# Patient Record
Sex: Female | Born: 1980 | Race: Black or African American | Hispanic: No | Marital: Single | Smoking: Current every day smoker
Health system: Southern US, Community
[De-identification: ages and names within clinical notes are randomized; demographics above are authoritative.]

## PROBLEM LIST (undated history)

## (undated) DIAGNOSIS — O09299 Supervision of pregnancy with other poor reproductive or obstetric history, unspecified trimester: Secondary | ICD-10-CM

## (undated) DIAGNOSIS — I10 Essential (primary) hypertension: Secondary | ICD-10-CM

## (undated) DIAGNOSIS — O09529 Supervision of elderly multigravida, unspecified trimester: Secondary | ICD-10-CM

## (undated) DIAGNOSIS — Z8759 Personal history of other complications of pregnancy, childbirth and the puerperium: Secondary | ICD-10-CM

## (undated) DIAGNOSIS — O24419 Gestational diabetes mellitus in pregnancy, unspecified control: Secondary | ICD-10-CM

## (undated) DIAGNOSIS — Z8489 Family history of other specified conditions: Secondary | ICD-10-CM

## (undated) DIAGNOSIS — D649 Anemia, unspecified: Secondary | ICD-10-CM

## (undated) HISTORY — DX: Supervision of elderly multigravida, unspecified trimester: O09.529

## (undated) HISTORY — DX: Anemia, unspecified: D64.9

## (undated) HISTORY — DX: Personal history of other complications of pregnancy, childbirth and the puerperium: Z87.59

## (undated) HISTORY — PX: DILATION AND CURETTAGE OF UTERUS: SHX78

## (undated) HISTORY — DX: Gestational diabetes mellitus in pregnancy, unspecified control: O24.419

## (undated) HISTORY — DX: Supervision of pregnancy with other poor reproductive or obstetric history, unspecified trimester: O09.299

---

## 2003-05-20 DIAGNOSIS — O1493 Unspecified pre-eclampsia, third trimester: Secondary | ICD-10-CM

## 2006-09-17 ENCOUNTER — Emergency Department: Payer: Self-pay

## 2008-04-02 IMAGING — US ABDOMEN ULTRASOUND
1 series · 17 of 25 positions shown · non-contrast
Comparison: none

REASON FOR EXAM: RUQ pain; evaluate GB; [HOSPITAL]
COMMENTS:

[Series 1: abdomen ultrasound · 17 of 54 slices shown]
[im 1/54]
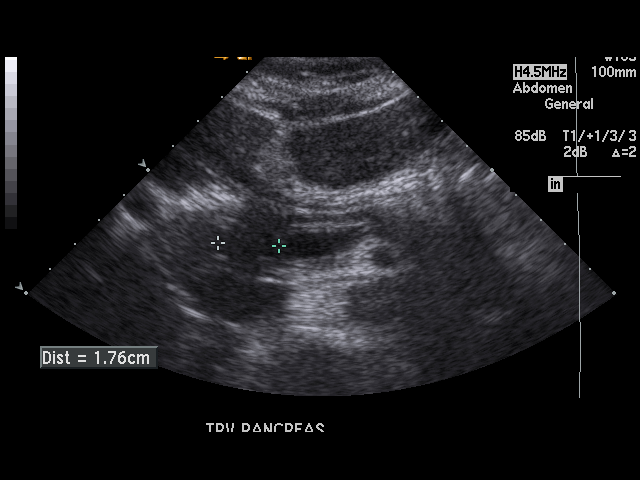
[im 5/54]
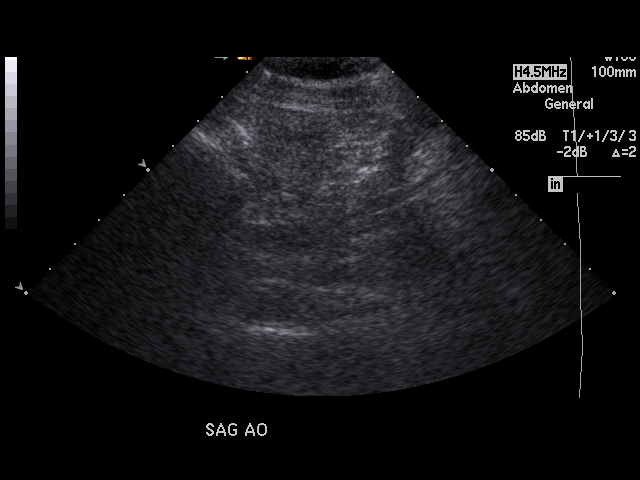
[im 7/54]
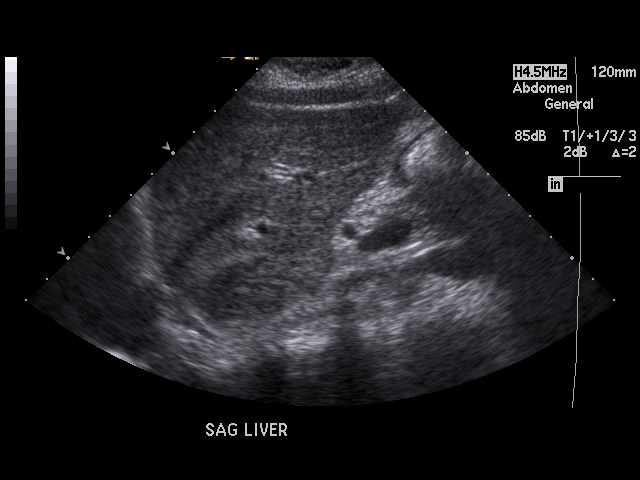
[im 12/54]
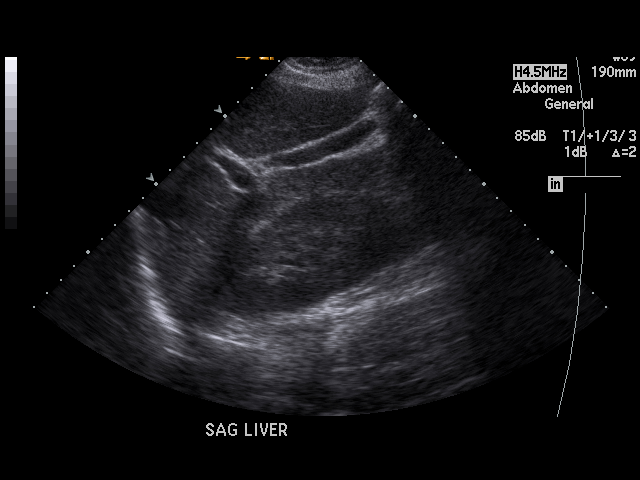
[im 14/54]
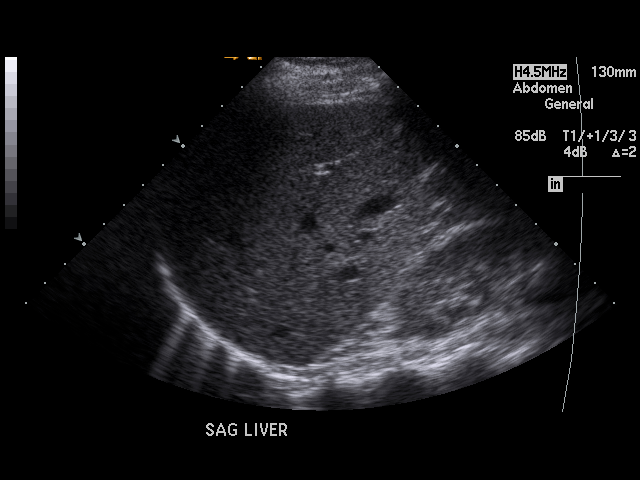
[im 18/54]
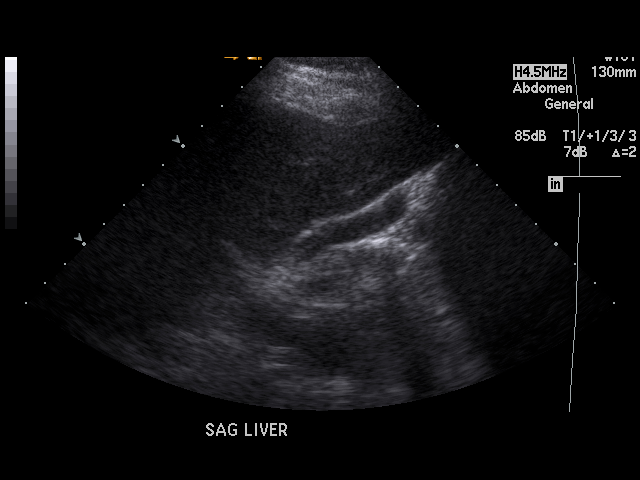
[im 20/54]
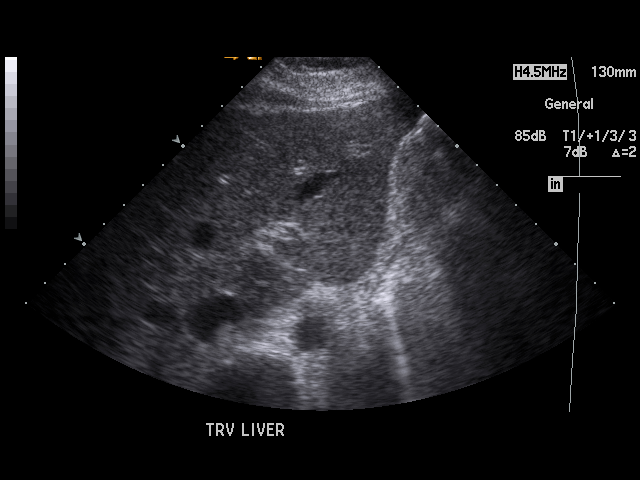
[im 25/54]
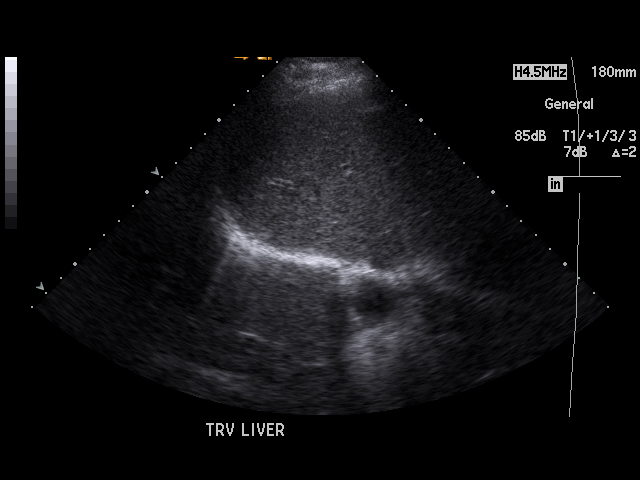
[im 27/54]
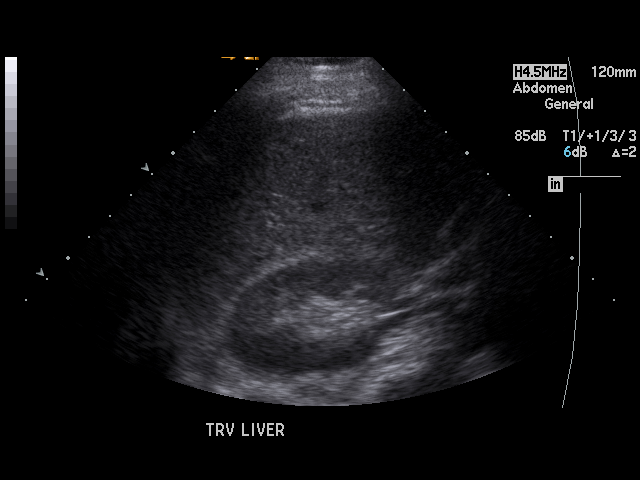
[im 29/54]
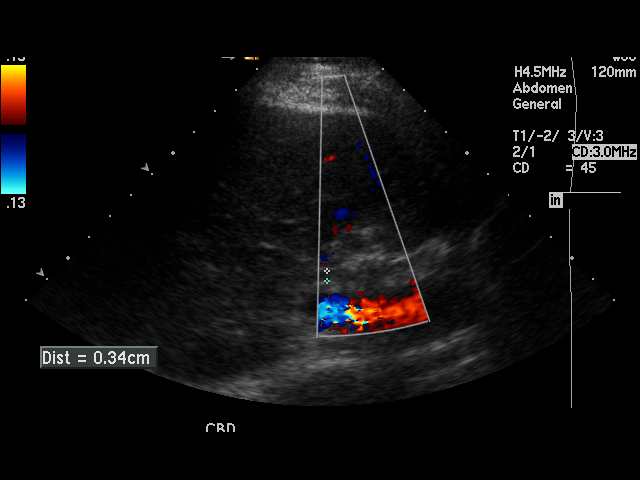
[im 34/54]
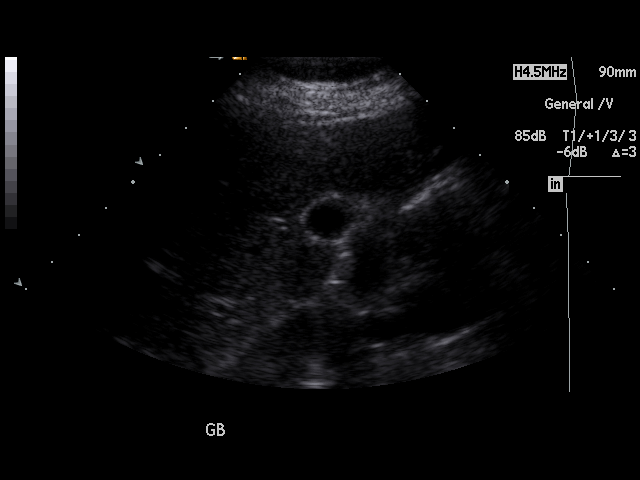
[im 36/54]
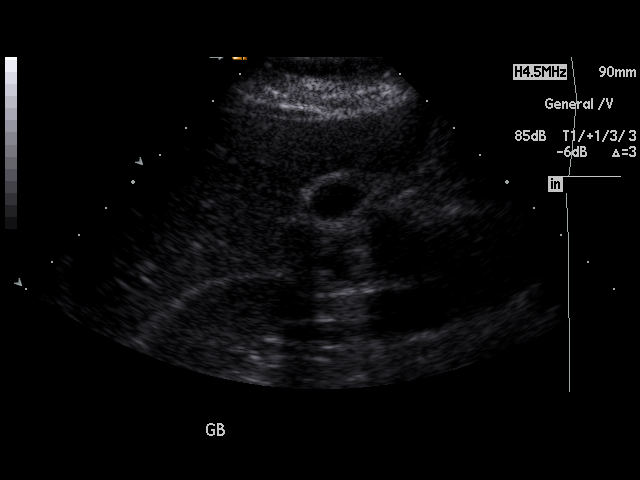
[im 40/54]
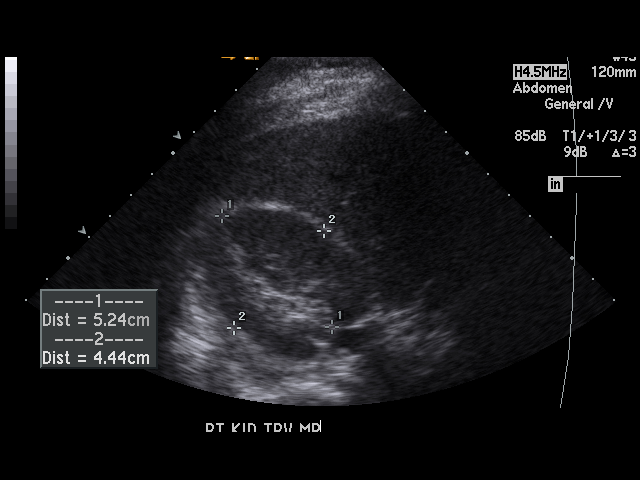
[im 42/54]
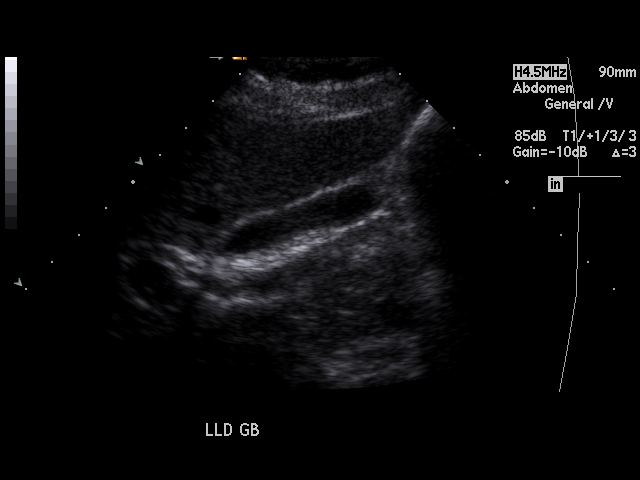
[im 47/54]
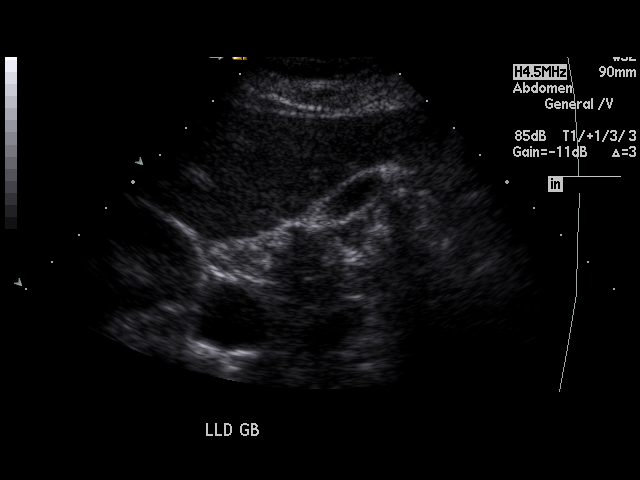
[im 49/54]
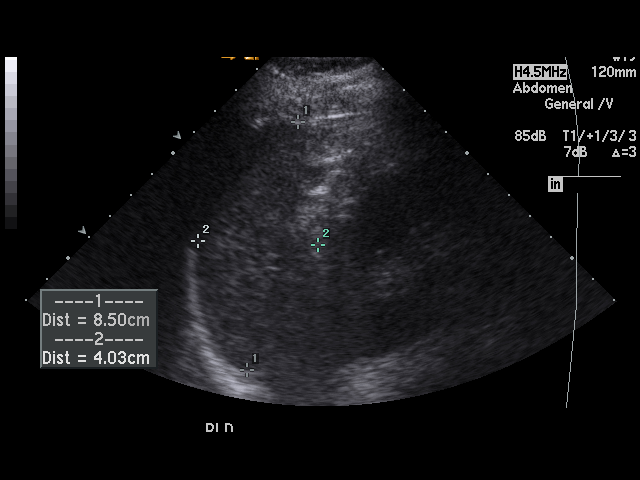
[im 54/54]
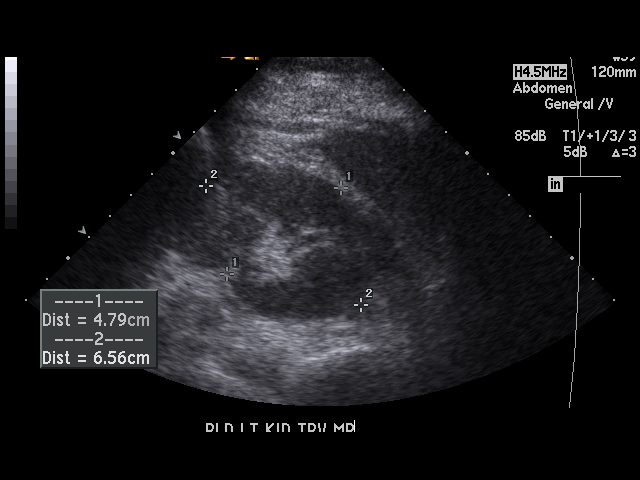

[17 of 25 positions shown; findings below may reference images not displayed]

PROCEDURE:     US  - US ABDOMEN GENERAL SURVEY  - September 17, 2006  [DATE]

RESULT:     The liver, spleen, pancreas and abdominal aorta are normal in
appearance. No gallstones are seen. There is no thickening of the
gallbladder wall. The common bile duct measures 3.4 mm in diameter, which is
within normal limits. The kidneys show no hydronephrosis. There is no
ascites.
IMPRESSION: 1.     No significant abnormalities are noted.

## 2012-11-15 ENCOUNTER — Other Ambulatory Visit: Payer: Self-pay

## 2012-11-19 LAB — WOUND CULTURE

## 2013-08-13 ENCOUNTER — Emergency Department: Payer: Self-pay | Admitting: Emergency Medicine

## 2016-02-04 LAB — OB RESULTS CONSOLE HEPATITIS B SURFACE ANTIGEN: Hepatitis B Surface Ag: NEGATIVE

## 2016-02-04 LAB — OB RESULTS CONSOLE ABO/RH: RH TYPE: POSITIVE

## 2016-02-04 LAB — OB RESULTS CONSOLE RUBELLA ANTIBODY, IGM: Rubella: NON-IMMUNE/NOT IMMUNE

## 2016-02-04 LAB — OB RESULTS CONSOLE HIV ANTIBODY (ROUTINE TESTING): HIV: NONREACTIVE

## 2016-02-04 LAB — OB RESULTS CONSOLE RPR: RPR: NONREACTIVE

## 2016-02-04 LAB — OB RESULTS CONSOLE VARICELLA ZOSTER ANTIBODY, IGG: Varicella: IMMUNE

## 2016-02-22 LAB — OB RESULTS CONSOLE HIV ANTIBODY (ROUTINE TESTING): HIV: NONREACTIVE

## 2016-02-22 LAB — OB RESULTS CONSOLE HGB/HCT, BLOOD
HCT: 31 %
Hemoglobin: 10.4 g/dL

## 2016-02-22 LAB — SICKLE CELL SCREEN: SICKLE CELL SCREEN: NORMAL

## 2016-02-22 LAB — OB RESULTS CONSOLE PLATELET COUNT: Platelets: 238 10*3/uL

## 2016-02-22 LAB — OB RESULTS CONSOLE RPR: RPR: NONREACTIVE

## 2016-05-01 NOTE — L&D Delivery Note (Signed)
Delivery Note At 4:13 AM a viable female was delivered via Vaginal, Spontaneous Delivery (Presentation:OA  ).  APGAR: 8, 9; weight 5 lb 8.2 oz (2500 g).   Placenta status:spontaneous and intact.  Cord: 3VC with the following complications: preeclampsia with severe features (BP), GDM, AMA.  Cord pH: N/A  Anesthesia:  Epidural Episiotomy:  none Lacerations:  none Suture Repair: none Est. Blood Loss (mL):  300  Mom to postpartum.  Baby to Couplet care / Skin to Skin.  Vena Austria 08/12/2016, 4:35 AM

## 2016-05-30 DIAGNOSIS — O09529 Supervision of elderly multigravida, unspecified trimester: Secondary | ICD-10-CM | POA: Diagnosis not present

## 2016-05-30 DIAGNOSIS — Z131 Encounter for screening for diabetes mellitus: Secondary | ICD-10-CM | POA: Diagnosis not present

## 2016-05-30 DIAGNOSIS — Z3A23 23 weeks gestation of pregnancy: Secondary | ICD-10-CM | POA: Diagnosis not present

## 2016-05-30 DIAGNOSIS — Z3A27 27 weeks gestation of pregnancy: Secondary | ICD-10-CM | POA: Diagnosis not present

## 2016-06-06 DIAGNOSIS — R7302 Impaired glucose tolerance (oral): Secondary | ICD-10-CM | POA: Diagnosis not present

## 2016-06-13 ENCOUNTER — Encounter: Payer: BLUE CROSS/BLUE SHIELD | Attending: Advanced Practice Midwife | Admitting: Dietician

## 2016-06-13 ENCOUNTER — Encounter: Payer: Self-pay | Admitting: Dietician

## 2016-06-13 VITALS — BP 136/70 | Ht 62.0 in | Wt 165.6 lb

## 2016-06-13 DIAGNOSIS — O24419 Gestational diabetes mellitus in pregnancy, unspecified control: Secondary | ICD-10-CM | POA: Insufficient documentation

## 2016-06-13 DIAGNOSIS — O24410 Gestational diabetes mellitus in pregnancy, diet controlled: Secondary | ICD-10-CM

## 2016-06-13 DIAGNOSIS — Z3A00 Weeks of gestation of pregnancy not specified: Secondary | ICD-10-CM | POA: Diagnosis not present

## 2016-06-13 NOTE — Progress Notes (Signed)
Appt. Start Time: 1030 Appt. End Time: 1200  GDM Class 1 Diabetes Overview - define DM; state own type of DM; identify functions of pancreas and insulin; define insulin deficiency vs insulin resistance  Psychosocial - identify DM as a source of stress; state the effects of stress on BG control; verbalize appropriate stress management techniques; identify personal stress issues   Nutritional Management - describe effects of food on blood glucose; identify sources of carbohydrate, protein and fat; verbalize the importance of balance meals in controlling blood glucose; identify meals as well balanced or not; estimate servings of carbohydrate from menus; use food labels to identify servings size, content of carbohydrate, fiber, protein, fat, saturated fat and sodium; recognize food sources of fat, saturated fat, trans fat, sodium and verbalize goals for intake; describe healthful appropriate food choices when dining out   Exercise - describe the effects of exercise on blood glucose and importance of regular exercise in controlling diabetes; state a plan for personal exercise; verbalize contraindications for exercise  Medications - state name, dose, timing of currently prescribed medications; describe types of medications available for diabetes  Insulin Training - prepare and administer insulin accurately; state correct rotation pattern; verbalize safe and lawful needle disposal  Self-Monitoring - state importance of HBGM and demo procedure accurately; use HBGM results to effectively manage diabetes; identify importance of regular HbA1C testing and goals for results  Acute Complications/Sick Day Guidelines - recognize hyperglycemia and hypoglycemia with causes and effects; identify blood glucose results as high, low or in control; list steps in treating and preventing high and low blood glucose; state appropriate measure to manage blood glucose when ill (need for meds, HBGM plan, when to call physician,  need for fluids)  Chronic Complications/Foot, Skin, Eye Dental Care - identify possible long-term complications of diabetes (retinopathy, neuropathy, nephropathy, cardiovascular disease, infections); explain steps in prevention and treatment of chronic complications; state importance of daily self-foot exams; describe how to examine feet and what to look for; explain appropriate eye and dental care  Lifestyle Changes/Goals & Health/Community Resources - state benefits of making appropriate lifestyle changes; identify habits that need to change (meals, tobacco, alcohol); identify strategies to reduce risk factors for personal health; set goals for proper diabetes care; state need for and frequency of healthcare follow-up; describe appropriate community resources for good health (ADA, web sites, apps)   Pregnancy/Sexual Health - define gestational diabetes; state importance of good blood glucose control and birth control prior to pregnancy; state importance of good blood glucose control in preventing sexual problems (impotence, vaginal dryness, infections, loss of desire); state relationship of blood glucose control and pregnancy outcome; describe risk of maternal and fetal complications  Teaching Materials Used: Meter-Accuchek Guide meter General Meal Planning Guidelines Daily Food Record Gestational Diabetes Booklet Gestational Video Goals for Healthy Pregnancy  

## 2016-06-13 NOTE — Patient Instructions (Signed)
Read booklet on Gestational Diabetes Follow Gestational Meal Planning Guidelines Complete a 3 Day Food Record and bring to next appointment Check blood sugars 4 x day - before breakfast and 2 hrs after every meal and record  Bring blood sugar log to all appointments Call MD for prescription for meter strips and lancets Strips   Accuchek Guide test strips  Lancets  Accuchek Fast Clix lancets Walk 20-30 minutes at least 5 x week if permitted by MD Next appointment  06-19-16

## 2016-06-16 ENCOUNTER — Ambulatory Visit: Payer: Self-pay | Admitting: *Deleted

## 2016-06-19 ENCOUNTER — Ambulatory Visit: Payer: BLUE CROSS/BLUE SHIELD | Admitting: Dietician

## 2016-06-21 ENCOUNTER — Telehealth: Payer: Self-pay | Admitting: Dietician

## 2016-06-21 NOTE — Telephone Encounter (Signed)
Called patient to reschedule appointment, which she missed on 06/19/16. Left voicemail message requesting a call back.

## 2016-06-27 ENCOUNTER — Encounter: Payer: BLUE CROSS/BLUE SHIELD | Admitting: Dietician

## 2016-06-27 ENCOUNTER — Encounter: Payer: Self-pay | Admitting: Obstetrics and Gynecology

## 2016-06-27 ENCOUNTER — Encounter: Payer: Self-pay | Admitting: Dietician

## 2016-06-27 ENCOUNTER — Ambulatory Visit (INDEPENDENT_AMBULATORY_CARE_PROVIDER_SITE_OTHER): Payer: BLUE CROSS/BLUE SHIELD | Admitting: Obstetrics and Gynecology

## 2016-06-27 VITALS — BP 128/66 | Wt 167.0 lb

## 2016-06-27 VITALS — BP 120/70 | Ht 62.0 in | Wt 167.2 lb

## 2016-06-27 DIAGNOSIS — O24415 Gestational diabetes mellitus in pregnancy, controlled by oral hypoglycemic drugs: Secondary | ICD-10-CM

## 2016-06-27 DIAGNOSIS — O09293 Supervision of pregnancy with other poor reproductive or obstetric history, third trimester: Secondary | ICD-10-CM | POA: Insufficient documentation

## 2016-06-27 DIAGNOSIS — O24410 Gestational diabetes mellitus in pregnancy, diet controlled: Secondary | ICD-10-CM

## 2016-06-27 DIAGNOSIS — Z23 Encounter for immunization: Secondary | ICD-10-CM

## 2016-06-27 DIAGNOSIS — O09523 Supervision of elderly multigravida, third trimester: Secondary | ICD-10-CM

## 2016-06-27 DIAGNOSIS — O24419 Gestational diabetes mellitus in pregnancy, unspecified control: Secondary | ICD-10-CM | POA: Diagnosis not present

## 2016-06-27 DIAGNOSIS — O99013 Anemia complicating pregnancy, third trimester: Secondary | ICD-10-CM

## 2016-06-27 DIAGNOSIS — Z3A31 31 weeks gestation of pregnancy: Secondary | ICD-10-CM

## 2016-06-27 HISTORY — DX: Supervision of elderly multigravida, third trimester: O09.523

## 2016-06-27 MED ORDER — METFORMIN HCL 500 MG PO TABS: 500.0000 mg | ORAL_TABLET | Freq: Two times a day (BID) | ORAL | 11 refills | Status: DC

## 2016-06-27 MED ORDER — RANITIDINE HCL 150 MG PO TABS: 150.0000 mg | ORAL_TABLET | Freq: Two times a day (BID) | ORAL | 11 refills | Status: DC

## 2016-06-27 NOTE — Progress Notes (Signed)
Pt had chest pain/heartburn for about 3 hours

## 2016-06-27 NOTE — Patient Instructions (Signed)
   Make sure to include enough protein at breakfast to feel full, and reduce hunger later in the day.   You can increase protein portions and/or "free" vegetables to improve fulness.

## 2016-06-27 NOTE — Progress Notes (Signed)
   Patient's verbal BG report indicates fasting BGs ranging 80-90; after breakfast BGs 80-120; after lunch and supper 160-180.   Patient's food history indicates she is eating at regular intervals. She reports feeling hungry after eating breakfast and sometimes after lunch, and feels she is then likely overeating carbohydrate at supper.   Provided 1800kcal meal plan, and wrote individualized menus based on patient's food preferences. Advised increasing portions of protein foods and/or "free" vegetables to help with hunger control.   Instructed patient on food safety, including avoidance of Listeriosis, and limiting mercury from fish.  Discussed importance of maintaining healthy lifestyle habits to reduce risk of Type 2 DM as well as Gestational DM with any future pregnancies.  Advised patient to use any remaining testing supplies to test some BGs after delivery, and to have BG tested ideally annually, as well as prior to attempting future pregnancies.

## 2016-06-27 NOTE — Progress Notes (Signed)
Routine Prenatal Care Visit  Subjective  Fetal Movement? yes Contractions? no Leaking Fluid? no Vaginal Bleeding? no  Objective   Vitals:   06/27/16 1619  BP: 128/66    @WEIGHTCHANGE @ Urine dipstick shows negative for all components.  General: NAD Pumonary: no increased work of breathing Abdomen: gravid, non-tender Extremities: no edema Psychiatric: mood appropriate, affect full  O -- -- Negative -- Nonreactive -- (10/06 0000)   Assessment   36 y.o. G1P0 at 4982w1d by  08/28/2016, by Ultrasound presenting for routine prenatal visit, GDM  pregnancy #5 Problems (from 11/22/15 to present)    No problems associated with this episode.      Plan  Gestational diabetes  - Discussed ADA diet, in addition for obese patient diagnosed prior to 20 weeks recommend caloric restriction to 25 Kcal/kg/day ideal body weight.  Carbohydrates should be limited to 33-40% of daily caloric intake with attention given to high glycemic index vs low glycemic index foods.  The remaining calories should be split approximately 20% protein, 40% fats.  Three meals a day with two snacks to spread carbohdyrate load was recommended. - Recommend lifestyles referral for more in depth diet teaching - Home glucose monitoring 4 times a day (AM fasting goal <100, 2-hr postprandial <120).  Importance in maintaining glucose log discussed as proper management will rely on assessing home BG readings.  Stressed team approach with patient being a vital member of this team in ensuring best possible outcome for her fetus. - If good control twice weekly follow up appopriate - Antepartum testing indicated at 32-34 weeks if on meds for glycemic control, monthly growth scan starting at 28 weeks - For diet controlled gestational diabetes induction by 40 weeks if favorable cervix, delivery at latest by 41 weeks - Evidence of fetal macrosomia or poor control may necessitate earlier delivery, 39 weeks or less.  If fetal weight  >4500g counseling regarding risk of shoulder dystocia and consideration of C-section discussed. - If pharmacotherapy should need to be started insulin remains the ADA recommended treatment.  Metformin may be used but this indication is not FDA approved, it readily crosses the placenta and while 2-year follow up data have not shown significant developmental differences long term follow up data is unavailable.  Glyburide may be used as a second line oral agent but is not superior to insulin in preventing macrosomia, may be associated with increased rates of neonatal hypyoglycemia, and is not FDA approved for this indication.  - Stressed that diabetes is a common complicating factor in pregnancy affecting about 7% of pregnancies - Managed properly and with patient adherence to monitoring, diet and lifestyle changes does not have to be associated with adverse pregnancy outcome.  However, uncontrolled may lead to adverse outcomes including still birth, shoulder dystocia, need for cesarean delivery. - Increased risk of preeclampsia in patient diagnosed with gestational diabetes discussed - Increased lifetime risk of develop Type II diabetes discussed  ACOG Practive Bulletin 190 "Gestatioal Diabetes" Updated Februrary 2018  - Fasting BG at goal, 2-hr postprandial lunch and dinner elevated, start metformin 500mg  po bid today, follow up ultrasound for growth in 1 week and to reassess BG control

## 2016-07-03 ENCOUNTER — Other Ambulatory Visit: Payer: Self-pay | Admitting: Obstetrics and Gynecology

## 2016-07-03 ENCOUNTER — Ambulatory Visit (INDEPENDENT_AMBULATORY_CARE_PROVIDER_SITE_OTHER): Payer: BLUE CROSS/BLUE SHIELD | Admitting: Obstetrics and Gynecology

## 2016-07-03 ENCOUNTER — Ambulatory Visit (INDEPENDENT_AMBULATORY_CARE_PROVIDER_SITE_OTHER): Payer: BLUE CROSS/BLUE SHIELD

## 2016-07-03 VITALS — BP 132/64 | Wt 166.0 lb

## 2016-07-03 DIAGNOSIS — O24415 Gestational diabetes mellitus in pregnancy, controlled by oral hypoglycemic drugs: Secondary | ICD-10-CM | POA: Diagnosis not present

## 2016-07-03 DIAGNOSIS — O99013 Anemia complicating pregnancy, third trimester: Secondary | ICD-10-CM

## 2016-07-03 DIAGNOSIS — O09293 Supervision of pregnancy with other poor reproductive or obstetric history, third trimester: Secondary | ICD-10-CM

## 2016-07-03 DIAGNOSIS — O09523 Supervision of elderly multigravida, third trimester: Secondary | ICD-10-CM

## 2016-07-03 DIAGNOSIS — Z3A31 31 weeks gestation of pregnancy: Secondary | ICD-10-CM

## 2016-07-03 DIAGNOSIS — Z3A32 32 weeks gestation of pregnancy: Secondary | ICD-10-CM

## 2016-07-03 NOTE — Progress Notes (Signed)
Growth US today

## 2016-07-03 NOTE — Progress Notes (Signed)
Noted some GI symptoms with metformin, only spent 3-hr at work last week.  Inquires about being taken out.  BG remain randomly elevated per patient no log today, growth appropriate AC small at 6.9%ile.  Will decrease glyburide to qHS, re-evaluate in 1 week

## 2016-07-04 ENCOUNTER — Telehealth: Payer: Self-pay | Admitting: Obstetrics and Gynecology

## 2016-07-04 NOTE — Telephone Encounter (Signed)
Pt is calling today needing to speak to Dr. Bonney AidStaebler about her short term disability form. Please advise Cb#[phone]

## 2016-07-04 NOTE — Telephone Encounter (Signed)
FYI, Pt states her employer will be faxing her FMLA paperwork to [employer] instead of her dropping them off. Pt was just wanting to make [employer] aware. I gave pt the office fax number.  KJ CMA.   [employer] sign and close once you review task. Thanks

## 2016-07-04 NOTE — Telephone Encounter (Signed)
We outlined some of her issues in her last note we can see if we can take her out of work already but her employer may or may not approve

## 2016-07-05 ENCOUNTER — Encounter: Payer: Self-pay | Admitting: Obstetrics and Gynecology

## 2016-07-11 ENCOUNTER — Encounter: Payer: BLUE CROSS/BLUE SHIELD | Admitting: Obstetrics & Gynecology

## 2016-07-12 ENCOUNTER — Ambulatory Visit (INDEPENDENT_AMBULATORY_CARE_PROVIDER_SITE_OTHER): Payer: BLUE CROSS/BLUE SHIELD | Admitting: Obstetrics and Gynecology

## 2016-07-12 VITALS — BP 118/70 | Wt 168.0 lb

## 2016-07-12 DIAGNOSIS — O09293 Supervision of pregnancy with other poor reproductive or obstetric history, third trimester: Secondary | ICD-10-CM

## 2016-07-12 DIAGNOSIS — Z3A33 33 weeks gestation of pregnancy: Secondary | ICD-10-CM

## 2016-07-12 DIAGNOSIS — O09523 Supervision of elderly multigravida, third trimester: Secondary | ICD-10-CM

## 2016-07-12 DIAGNOSIS — O99013 Anemia complicating pregnancy, third trimester: Secondary | ICD-10-CM

## 2016-07-12 DIAGNOSIS — O24415 Gestational diabetes mellitus in pregnancy, controlled by oral hypoglycemic drugs: Secondary | ICD-10-CM

## 2016-07-12 MED ORDER — GLYBURIDE 5 MG PO TABS: 5.0000 mg | ORAL_TABLET | Freq: Two times a day (BID) | ORAL | 0 refills | Status: DC

## 2016-07-12 NOTE — Progress Notes (Signed)
    Routine Prenatal Care Visit  Subjective  Fetal Movement? yes Contractions? no Leaking Fluid? no Vaginal Bleeding? no  Objective   Vitals:   07/12/16 1344  BP: 118/70    @WEIGHTCHANGE @ Urine dipstick shows negative for all components.  General: NAD Pumonary: no increased work of breathing Abdomen: gravid, non-tender, fundal height 34, fetal heart tones 145BPM Extremities: no edema Psychiatric: mood appropriate, affect full  O -- -- Negative -- Nonreactive -- (10/06 0000)       Assessment   36 y.o. [id number] at 5940w2d by  08/28/2016, by Ultrasound presenting for routine prenatal visit  pregnancy #5 Problems (from 11/22/15 to present)    Problem Noted Resolved   AMA (advanced maternal age) multigravida 35+, third trimester 06/27/2016 by Vena AustriaAndreas Kanylah Muench, MD No       Plan   Problem List Items Addressed This Visit      Endocrine   Oral hypoglycemic controlled White classification A2 gestational diabetes mellitus (GDM) - Primary   Relevant Medications   glyBURIDE (DIABETA) 5 MG tablet   Other Relevant Orders   US OB Limited     Other   AMA (advanced maternal age) multigravida 35+, third trimester   Relevant Orders   US OB Limited   Hx of preeclampsia, prior pregnancy, currently pregnant, third trimester   Anemia affecting pregnancy in third trimester    Other Visit Diagnoses    [redacted] weeks gestation of pregnancy       Relevant Orders   US OB Limited     Fasting 1/8 abnormal, postprandial breakfast 7/7 abnormal, postprandial lunch 4/7 abnormal, postprandial dinner 5/7 abnormal.  Switch to glyburide discussed increased rate of neonatal hypoglycemia as compared to metformin.  Also discussed that we may need to start insulin if able to achieve better glycemic control.  Start APT next visit at 34 weeks

## 2016-07-12 NOTE — Progress Notes (Signed)
Also discussed to start out of work for continued discomforts of pregnancy low back pain, and starting more frequent antepartum testing once weekly potentially moving to twice weekly

## 2016-07-12 NOTE — Patient Instructions (Signed)
Gestational Diabetes Mellitus, Self Care Caring for yourself after you have been diagnosed with gestational diabetes (gestational diabetes mellitus) means keeping your blood sugar (glucose) under control with a balance of:  Nutrition.  Exercise.  Lifestyle changes.  Medicines or insulin, if necessary.  Support from your team of health care providers and others.  The following information explains what you need to know to manage your gestational diabetes at home. What do I need to do to manage my blood glucose?  Check your blood glucose every day during your pregnancy. Do this as often as told by your health care provider.  Contact your health care provider if your blood glucose is above your target for 2 tests in a row. Your health care provider will set individualized treatment goals for you. Generally, the goal of treatment is to maintain the following blood glucose levels during pregnancy:  After not eating for 8 hours (after fasting): at or below 95 mg/dL (5.3 mmol/L).  After meals (postprandial): ? One hour after a meal: at or below 140 mg/dL (7.8 mmol/L). ? Two hours after a meal: at or below 120 mg/dL (6.7 mmol/L).  A1c (hemoglobin A1c) level: 6-6.5%.  What do I need to know about hyperglycemia and hypoglycemia? What is hyperglycemia? Hyperglycemia, also called high blood glucose, occurs when blood glucose is too high. Make sure you know the early signs of hyperglycemia, such as:  Increased thirst.  Hunger.  Feeling very tired.  Needing to urinate more often than usual.  Blurry vision.  What is hypoglycemia? Hypoglycemia, also called low blood glucose, occurswith a blood glucose level at or below 70 mg/dL (3.9 mmol/L). The risk for hypoglycemia increases during or after exercise, during sleep, during illness, and when skipping meals or not eating for a long time (fasting). It is important to know the symptoms of hypoglycemia and treat it right away. Always have a  15-gram rapid-acting carbohydrate snack with you to treat low blood glucose.Family members and close friends should also know the symptoms and should understand how to treat hypoglycemia, in case you are not able to treat yourself. What are the symptoms of hypoglycemia? Hypoglycemia symptoms can include:  Hunger.  Anxiety.  Sweating and feeling clammy.  Confusion.  Dizziness or feeling light-headed.  Sleepiness.  Nausea.  Increased heart rate.  Headache.  Blurry vision.  Seizure.  Nightmares.  Tingling or numbness around the mouth, lips, or tongue.  A change in speech.  Decreased ability to concentrate.  A change in coordination.  Restless sleep.  Tremors or shakes.  Fainting.  Irritability.  How do I treat hypoglycemia?  If you are alert and able to swallow safely, follow the 15:15 rule:  Take 15 grams of a rapid-acting carbohydrate. Rapid-acting options include: ? 1 tube of glucose gel. ? 3 glucose pills. ? 6-8 pieces of hard candy. ? 4 oz (120 mL) of fruit juice. ? 4 oz (120 mL) of regular (not diet) soda.  Check your blood glucose 15 minutes after you take the carbohydrate.  If the repeat blood glucose level is still at or below 70 mg/dL (3.9 mmol/L), take 15 grams of a carbohydrate again.  If your blood glucose level does not increase above 70 mg/dL (3.9 mmol/L) after 3 tries, seek emergency medical care.  After your blood glucose level returns to normal, eat a meal or a snack within 1 hour.  How do I treat severe hypoglycemia? Severe hypoglycemia is when your blood glucose level is at or below 54 mg/dL (  3 mmol/L). Severe hypoglycemia is an emergency. Do not wait to see if the symptoms will go away. Get medical help right away. Call your local emergency services (911 in the [place] Do not drive yourself to the hospital. If you have severe hypoglycemia and you cannot eat or drink, you may need an injection of glucagon. A family member or close  friend should learn how to check your blood glucose and how to give you a glucagon injection. Ask your health care provider if you need to have an emergency glucagon injection kit available. Severe hypoglycemia may need to be treated in a hospital. The treatment may include getting glucose through an IV tube. You may also need treatment for the cause of your hypoglycemia. What else can I do to manage my gestational diabetes? Take your diabetes medicines as told  If your health care provider prescribed insulin or diabetes medicines, take them every day.  Do not run out of insulin or other diabetes medicines that you take. Plan ahead so you always have these available.  If you use insulin, adjust your dosage based on how physically active you are and what foods you eat. Your health care provider will tell you how to adjust your dosage. Make healthy food choices  The things that you eat and drink affect your blood glucose. Making good choices helps to control your diabetes and prevent other health problems. A healthy meal plan includes eating lean proteins, complex carbohydrates, fresh fruits and vegetables, low-fat dairy products, and healthy fats. Make an appointment to see a diet and nutrition specialist (registered dietitian) to help you create an eating plan that is right for you. Make sure that you:  Follow instructions from your health care provider about eating or drinking restrictions.  Drink enough fluid to keep your urine clear or pale yellow.  Eat healthy snacks between nutritious meals.  Track the carbohydrates that you eat. Do this by reading food labels and learning the standard serving sizes of foods.  Follow your sick day plan whenever you cannot eat or drink as usual. Make this plan in advance with your health care provider.  Stay active   Do at least 30 minutes of physical activity a day, or as much physical activity as your health care provider recommends during your  pregnancy. ? Doing 10 minutes of exercise 30 minutes after each meal may help to control postprandial blood glucose levels.  If you start a new exercise or activity, work with your health care provider to adjust your insulin, medicines, or food intake as needed. Make healthy lifestyle choices  Do not drink alcohol.  Do not use any tobacco products, such as cigarettes, chewing tobacco, and e-cigarettes. If you need help quitting, ask your health care provider.  Learn to manage stress. If you need help with this, ask your health care provider. Care for your body  Keep your immunizations up to date.  Brush your teeth and gums two times a day, and floss at least one time a day.  Visit your dentist at least once every 6 months.  Maintain a healthy weight during your pregnancy. General instructions   Take over-the-counter and prescription medicines only as told by your health care provider.  Talk with your health care provider about your risk for high blood pressure during pregnancy (preeclampsia or eclampsia).  Share your diabetes management plan with people in your workplace, school, and household.  Check your urine for ketones during your pregnancy when you are ill and   as told by your health care provider.  Carry a medical alert card or wear medical alert jewelry.  Ask your health care provider: ? Do I need to meet with a diabetes educator? ? Where can I find a support group for people with diabetes?  Keep all follow-up visits during your pregnancy (prenatal) and after delivery (postnatal) as told by your health care provider. This is important. Get the care that you need after delivery  Have your blood glucose level checked 4-12 weeks after delivery. This is done with an oral glucose tolerance test (OGTT).  Get screened for diabetes at least every 3 years, or as often as told by your health care provider. Where to find more information: To learn more about gestational  diabetes, visit:  American Diabetes Association (ADA): www.diabetes.org/diabetes-basics/gestational  Centers for Disease Control and Prevention (CDC): www.cdc.gov/diabetes/pubs/pdf/gestationalDiabetes.pdf  This information is not intended to replace advice given to you by your health care provider. Make sure you discuss any questions you have with your health care provider. Document Released: 08/09/2015 Document Revised: 09/23/2015 Document Reviewed: 05/21/2015 Elsevier Interactive Patient Education  2017 [employer]  

## 2016-07-18 DIAGNOSIS — O09523 Supervision of elderly multigravida, third trimester: Secondary | ICD-10-CM

## 2016-07-18 DIAGNOSIS — Z720 Tobacco use: Secondary | ICD-10-CM | POA: Insufficient documentation

## 2016-07-18 LAB — GLUCOSE TOLERANCE, 3 HOURS
GLUCOSE 1 HOUR: 181
GLUCOSE 2 HOUR: 192
Glucose 3 Hour: 105
Glucose Fasting: 87

## 2016-07-18 LAB — GLUCOSE, 1 HOUR GESTATIONAL: GESTATIONAL DIABETES SCREEN: 174

## 2016-07-19 ENCOUNTER — Ambulatory Visit (INDEPENDENT_AMBULATORY_CARE_PROVIDER_SITE_OTHER): Payer: BLUE CROSS/BLUE SHIELD

## 2016-07-19 ENCOUNTER — Ambulatory Visit (INDEPENDENT_AMBULATORY_CARE_PROVIDER_SITE_OTHER): Payer: BLUE CROSS/BLUE SHIELD | Admitting: Advanced Practice Midwife

## 2016-07-19 VITALS — BP 138/70 | Wt 168.0 lb

## 2016-07-19 DIAGNOSIS — O09523 Supervision of elderly multigravida, third trimester: Secondary | ICD-10-CM

## 2016-07-19 DIAGNOSIS — Z3A33 33 weeks gestation of pregnancy: Secondary | ICD-10-CM | POA: Diagnosis not present

## 2016-07-19 DIAGNOSIS — Z3A34 34 weeks gestation of pregnancy: Secondary | ICD-10-CM

## 2016-07-19 DIAGNOSIS — O24415 Gestational diabetes mellitus in pregnancy, controlled by oral hypoglycemic drugs: Secondary | ICD-10-CM | POA: Diagnosis not present

## 2016-07-19 DIAGNOSIS — O09293 Supervision of pregnancy with other poor reproductive or obstetric history, third trimester: Secondary | ICD-10-CM

## 2016-07-19 LAB — FETAL NONSTRESS TEST

## 2016-07-19 NOTE — Patient Instructions (Signed)
Gestational Diabetes Mellitus, Self Care Caring for yourself after you have been diagnosed with gestational diabetes (gestational diabetes mellitus) means keeping your blood sugar (glucose) under control with a balance of:  Nutrition.  Exercise.  Lifestyle changes.  Medicines or insulin, if necessary.  Support from your team of health care providers and others.  The following information explains what you need to know to manage your gestational diabetes at home. What do I need to do to manage my blood glucose?  Check your blood glucose every day during your pregnancy. Do this as often as told by your health care provider.  Contact your health care provider if your blood glucose is above your target for 2 tests in a row. Your health care provider will set individualized treatment goals for you. Generally, the goal of treatment is to maintain the following blood glucose levels during pregnancy:  After not eating for 8 hours (after fasting): at or below 95 mg/dL (5.3 mmol/L).  After meals (postprandial): ? One hour after a meal: at or below 140 mg/dL (7.8 mmol/L). ? Two hours after a meal: at or below 120 mg/dL (6.7 mmol/L).  A1c (hemoglobin A1c) level: 6-6.5%.  What do I need to know about hyperglycemia and hypoglycemia? What is hyperglycemia? Hyperglycemia, also called high blood glucose, occurs when blood glucose is too high. Make sure you know the early signs of hyperglycemia, such as:  Increased thirst.  Hunger.  Feeling very tired.  Needing to urinate more often than usual.  Blurry vision.  What is hypoglycemia? Hypoglycemia, also called low blood glucose, occurswith a blood glucose level at or below 70 mg/dL (3.9 mmol/L). The risk for hypoglycemia increases during or after exercise, during sleep, during illness, and when skipping meals or not eating for a long time (fasting). It is important to know the symptoms of hypoglycemia and treat it right away. Always have a  15-gram rapid-acting carbohydrate snack with you to treat low blood glucose.Family members and close friends should also know the symptoms and should understand how to treat hypoglycemia, in case you are not able to treat yourself. What are the symptoms of hypoglycemia? Hypoglycemia symptoms can include:  Hunger.  Anxiety.  Sweating and feeling clammy.  Confusion.  Dizziness or feeling light-headed.  Sleepiness.  Nausea.  Increased heart rate.  Headache.  Blurry vision.  Seizure.  Nightmares.  Tingling or numbness around the mouth, lips, or tongue.  A change in speech.  Decreased ability to concentrate.  A change in coordination.  Restless sleep.  Tremors or shakes.  Fainting.  Irritability.  How do I treat hypoglycemia?  If you are alert and able to swallow safely, follow the 15:15 rule:  Take 15 grams of a rapid-acting carbohydrate. Rapid-acting options include: ? 1 tube of glucose gel. ? 3 glucose pills. ? 6-8 pieces of hard candy. ? 4 oz (120 mL) of fruit juice. ? 4 oz (120 mL) of regular (not diet) soda.  Check your blood glucose 15 minutes after you take the carbohydrate.  If the repeat blood glucose level is still at or below 70 mg/dL (3.9 mmol/L), take 15 grams of a carbohydrate again.  If your blood glucose level does not increase above 70 mg/dL (3.9 mmol/L) after 3 tries, seek emergency medical care.  After your blood glucose level returns to normal, eat a meal or a snack within 1 hour.  How do I treat severe hypoglycemia? Severe hypoglycemia is when your blood glucose level is at or below 54 mg/dL (  3 mmol/L). Severe hypoglycemia is an emergency. Do not wait to see if the symptoms will go away. Get medical help right away. Call your local emergency services (911 in the [place] Do not drive yourself to the hospital. If you have severe hypoglycemia and you cannot eat or drink, you may need an injection of glucagon. A family member or close  friend should learn how to check your blood glucose and how to give you a glucagon injection. Ask your health care provider if you need to have an emergency glucagon injection kit available. Severe hypoglycemia may need to be treated in a hospital. The treatment may include getting glucose through an IV tube. You may also need treatment for the cause of your hypoglycemia. What else can I do to manage my gestational diabetes? Take your diabetes medicines as told  If your health care provider prescribed insulin or diabetes medicines, take them every day.  Do not run out of insulin or other diabetes medicines that you take. Plan ahead so you always have these available.  If you use insulin, adjust your dosage based on how physically active you are and what foods you eat. Your health care provider will tell you how to adjust your dosage. Make healthy food choices  The things that you eat and drink affect your blood glucose. Making good choices helps to control your diabetes and prevent other health problems. A healthy meal plan includes eating lean proteins, complex carbohydrates, fresh fruits and vegetables, low-fat dairy products, and healthy fats. Make an appointment to see a diet and nutrition specialist (registered dietitian) to help you create an eating plan that is right for you. Make sure that you:  Follow instructions from your health care provider about eating or drinking restrictions.  Drink enough fluid to keep your urine clear or pale yellow.  Eat healthy snacks between nutritious meals.  Track the carbohydrates that you eat. Do this by reading food labels and learning the standard serving sizes of foods.  Follow your sick day plan whenever you cannot eat or drink as usual. Make this plan in advance with your health care provider.  Stay active   Do at least 30 minutes of physical activity a day, or as much physical activity as your health care provider recommends during your  pregnancy. ? Doing 10 minutes of exercise 30 minutes after each meal may help to control postprandial blood glucose levels.  If you start a new exercise or activity, work with your health care provider to adjust your insulin, medicines, or food intake as needed. Make healthy lifestyle choices  Do not drink alcohol.  Do not use any tobacco products, such as cigarettes, chewing tobacco, and e-cigarettes. If you need help quitting, ask your health care provider.  Learn to manage stress. If you need help with this, ask your health care provider. Care for your body  Keep your immunizations up to date.  Brush your teeth and gums two times a day, and floss at least one time a day.  Visit your dentist at least once every 6 months.  Maintain a healthy weight during your pregnancy. General instructions   Take over-the-counter and prescription medicines only as told by your health care provider.  Talk with your health care provider about your risk for high blood pressure during pregnancy (preeclampsia or eclampsia).  Share your diabetes management plan with people in your workplace, school, and household.  Check your urine for ketones during your pregnancy when you are ill and   as told by your health care provider.  Carry a medical alert card or wear medical alert jewelry.  Ask your health care provider: ? Do I need to meet with a diabetes educator? ? Where can I find a support group for people with diabetes?  Keep all follow-up visits during your pregnancy (prenatal) and after delivery (postnatal) as told by your health care provider. This is important. Get the care that you need after delivery  Have your blood glucose level checked 4-12 weeks after delivery. This is done with an oral glucose tolerance test (OGTT).  Get screened for diabetes at least every 3 years, or as often as told by your health care provider. Where to find more information: To learn more about gestational  diabetes, visit:  American Diabetes Association (ADA): www.diabetes.org/diabetes-basics/gestational  Centers for Disease Control and Prevention (CDC): www.cdc.gov/diabetes/pubs/pdf/gestationalDiabetes.pdf  This information is not intended to replace advice given to you by your health care provider. Make sure you discuss any questions you have with your health care provider. Document Released: 08/09/2015 Document Revised: 09/23/2015 Document Reviewed: 05/21/2015 Elsevier Interactive Patient Education  2017 [employer]  

## 2016-07-19 NOTE — Progress Notes (Signed)
Baseline: 140 Variability: moderate Accelerations: present Decelerations: absent Tocometry: not measured. Occasional Deberah PeltonBraxton Hicks per pt report The patient was monitored for 25 minutes, fetal heart rate tracing was deemed reactive, category I tracing.  AFI: 11.8 cm  Pt did not bring BS log. She states values have been wnl this past week. Encouraged to continue with healthy diet, activity and medication, and to bring BS log to nv.   Tresea MallJane Chevy Sweigert, CNM

## 2016-07-26 ENCOUNTER — Ambulatory Visit (INDEPENDENT_AMBULATORY_CARE_PROVIDER_SITE_OTHER): Payer: BLUE CROSS/BLUE SHIELD | Admitting: Advanced Practice Midwife

## 2016-07-26 VITALS — BP 128/74 | Wt 167.0 lb

## 2016-07-26 DIAGNOSIS — Z3A35 35 weeks gestation of pregnancy: Secondary | ICD-10-CM

## 2016-07-26 DIAGNOSIS — O09523 Supervision of elderly multigravida, third trimester: Secondary | ICD-10-CM

## 2016-07-26 LAB — FETAL NONSTRESS TEST

## 2016-07-26 NOTE — Progress Notes (Signed)
C/o carpal tunnel in right hand. Discussed comfort measures.   NST today reactive Baseline: 130 Variability: moderate Accelerations: present Decelerations: absent Uterus: no reported contractions The patient was monitored for 20 minutes, the tracing was off baby for several minutes but repositioned and fetal heart rate tracing was deemed reactive, Category I tracing  Pt reports blood sugar levels in normal range.

## 2016-07-27 ENCOUNTER — Other Ambulatory Visit: Payer: Self-pay | Admitting: Advanced Practice Midwife

## 2016-07-27 DIAGNOSIS — O24419 Gestational diabetes mellitus in pregnancy, unspecified control: Secondary | ICD-10-CM

## 2016-08-01 ENCOUNTER — Ambulatory Visit (INDEPENDENT_AMBULATORY_CARE_PROVIDER_SITE_OTHER): Payer: BLUE CROSS/BLUE SHIELD | Admitting: Advanced Practice Midwife

## 2016-08-01 ENCOUNTER — Ambulatory Visit (INDEPENDENT_AMBULATORY_CARE_PROVIDER_SITE_OTHER): Payer: BLUE CROSS/BLUE SHIELD

## 2016-08-01 VITALS — BP 126/88 | Wt 168.0 lb

## 2016-08-01 DIAGNOSIS — Z3A36 36 weeks gestation of pregnancy: Secondary | ICD-10-CM

## 2016-08-01 DIAGNOSIS — O24415 Gestational diabetes mellitus in pregnancy, controlled by oral hypoglycemic drugs: Secondary | ICD-10-CM

## 2016-08-01 DIAGNOSIS — Z113 Encounter for screening for infections with a predominantly sexual mode of transmission: Secondary | ICD-10-CM | POA: Diagnosis not present

## 2016-08-01 DIAGNOSIS — O09523 Supervision of elderly multigravida, third trimester: Secondary | ICD-10-CM

## 2016-08-01 DIAGNOSIS — O24419 Gestational diabetes mellitus in pregnancy, unspecified control: Secondary | ICD-10-CM

## 2016-08-01 DIAGNOSIS — Z3685 Encounter for antenatal screening for Streptococcus B: Secondary | ICD-10-CM

## 2016-08-01 LAB — FETAL NONSTRESS TEST

## 2016-08-01 NOTE — Progress Notes (Signed)
NST reactive today 130 baseline, moderate variability, +accelerations, -decelerations, AFI 10, Placenta is noted to be 2.27 cm from cervix today at the closest place. Did not bring BS log. She reports that fasting levels are normal. She has a few elevated post prandial in the past week probably due to lemonade or sweet tea when she gets "tired of water". Encouraged to bring log and to stay on healthy diet/increased activity.

## 2016-08-01 NOTE — Progress Notes (Signed)
GBS/Aptima today. NST.

## 2016-08-03 LAB — STREP GP B NAA: STREP GROUP B AG: NEGATIVE

## 2016-08-03 LAB — GC/CHLAMYDIA PROBE AMP
Chlamydia trachomatis, NAA: NEGATIVE
Neisseria gonorrhoeae by PCR: NEGATIVE

## 2016-08-07 ENCOUNTER — Other Ambulatory Visit: Payer: Self-pay | Admitting: Obstetrics & Gynecology

## 2016-08-07 DIAGNOSIS — O24415 Gestational diabetes mellitus in pregnancy, controlled by oral hypoglycemic drugs: Secondary | ICD-10-CM

## 2016-08-08 ENCOUNTER — Ambulatory Visit (INDEPENDENT_AMBULATORY_CARE_PROVIDER_SITE_OTHER): Payer: BLUE CROSS/BLUE SHIELD | Admitting: Obstetrics & Gynecology

## 2016-08-08 ENCOUNTER — Ambulatory Visit (INDEPENDENT_AMBULATORY_CARE_PROVIDER_SITE_OTHER): Payer: BLUE CROSS/BLUE SHIELD

## 2016-08-08 VITALS — BP 122/80 | Wt 171.0 lb

## 2016-08-08 DIAGNOSIS — O24415 Gestational diabetes mellitus in pregnancy, controlled by oral hypoglycemic drugs: Secondary | ICD-10-CM

## 2016-08-08 DIAGNOSIS — Z3A37 37 weeks gestation of pregnancy: Secondary | ICD-10-CM

## 2016-08-08 DIAGNOSIS — O99013 Anemia complicating pregnancy, third trimester: Secondary | ICD-10-CM

## 2016-08-08 DIAGNOSIS — O09523 Supervision of elderly multigravida, third trimester: Secondary | ICD-10-CM

## 2016-08-08 NOTE — Progress Notes (Signed)
[place] discussed, AFI 5.74.  NST R. No c/os.  BS often has elevations.  Glyburide some but misses doses.  Stress with care of sick mother and other kids, poor other support.    Plan NST and AFI in 2-3 days.  IOL 38 weeks or before if oligohydramnios.  AMA-offered genetic counseling, and prenatal genetic testing - Desires Informaseq - normal, XY  Preeclampsia with G2-baseline CMP (WNL)and PC (109) ratio -  Get sickledex with Informaseq at 10/24 visit - normal  O POS/ RNI/ VI/ neg rpr/neg HBsAG/antibody neg/ HIV neg -  1 hr GTT 174-3hr GTT ordered - 2/4 abnormal. pt notified and referral sent for lifestyles. GDMA2 on Glyburide last few weeks. 2/2 Mild anemia (hmg 10.2gm/dl)-starting vitamins with iron -   A NST procedure was performed with FHR monitoring and a normal baseline established, appropriate time of 20-40 minutes of evaluation, and accels >2 seen w 15x15 characteristics.  Results show a REACTIVE NST.

## 2016-08-10 ENCOUNTER — Other Ambulatory Visit: Payer: Self-pay | Admitting: Obstetrics and Gynecology

## 2016-08-10 ENCOUNTER — Ambulatory Visit (INDEPENDENT_AMBULATORY_CARE_PROVIDER_SITE_OTHER): Payer: BLUE CROSS/BLUE SHIELD

## 2016-08-10 ENCOUNTER — Ambulatory Visit (INDEPENDENT_AMBULATORY_CARE_PROVIDER_SITE_OTHER): Payer: BLUE CROSS/BLUE SHIELD | Admitting: Obstetrics and Gynecology

## 2016-08-10 ENCOUNTER — Inpatient Hospital Stay
Admission: RE | Admit: 2016-08-10 | Discharge: 2016-08-13 | DRG: 775 | Disposition: A | Discharge status: Home / Self Care | Payer: BLUE CROSS/BLUE SHIELD | Attending: Obstetrics and Gynecology | Admitting: Obstetrics and Gynecology

## 2016-08-10 ENCOUNTER — Encounter: Payer: Self-pay | Admitting: *Deleted

## 2016-08-10 VITALS — BP 144/92 | Wt 169.0 lb

## 2016-08-10 DIAGNOSIS — O09523 Supervision of elderly multigravida, third trimester: Secondary | ICD-10-CM

## 2016-08-10 DIAGNOSIS — O99013 Anemia complicating pregnancy, third trimester: Secondary | ICD-10-CM

## 2016-08-10 DIAGNOSIS — O133 Gestational [pregnancy-induced] hypertension without significant proteinuria, third trimester: Secondary | ICD-10-CM

## 2016-08-10 DIAGNOSIS — O139 Gestational [pregnancy-induced] hypertension without significant proteinuria, unspecified trimester: Secondary | ICD-10-CM | POA: Diagnosis present

## 2016-08-10 DIAGNOSIS — O1494 Unspecified pre-eclampsia, complicating childbirth: Secondary | ICD-10-CM | POA: Diagnosis not present

## 2016-08-10 DIAGNOSIS — O24415 Gestational diabetes mellitus in pregnancy, controlled by oral hypoglycemic drugs: Secondary | ICD-10-CM

## 2016-08-10 DIAGNOSIS — O163 Unspecified maternal hypertension, third trimester: Secondary | ICD-10-CM | POA: Diagnosis present

## 2016-08-10 DIAGNOSIS — O09293 Supervision of pregnancy with other poor reproductive or obstetric history, third trimester: Secondary | ICD-10-CM

## 2016-08-10 DIAGNOSIS — Z87891 Personal history of nicotine dependence: Secondary | ICD-10-CM | POA: Diagnosis not present

## 2016-08-10 DIAGNOSIS — Z3A37 37 weeks gestation of pregnancy: Secondary | ICD-10-CM

## 2016-08-10 DIAGNOSIS — R030 Elevated blood-pressure reading, without diagnosis of hypertension: Secondary | ICD-10-CM | POA: Diagnosis not present

## 2016-08-10 DIAGNOSIS — O24425 Gestational diabetes mellitus in childbirth, controlled by oral hypoglycemic drugs: Secondary | ICD-10-CM | POA: Diagnosis present

## 2016-08-10 LAB — CBC
HEMATOCRIT: 35.5 % (ref 35.0–47.0)
HEMOGLOBIN: 11.9 g/dL — AB (ref 12.0–16.0)
MCH: 27.8 pg (ref 26.0–34.0)
MCHC: 33.6 g/dL (ref 32.0–36.0)
MCV: 82.7 fL (ref 80.0–100.0)
Platelets: 161 10*3/uL (ref 150–440)
RBC: 4.29 MIL/uL (ref 3.80–5.20)
RDW: 13.9 % (ref 11.5–14.5)
WBC: 5.2 10*3/uL (ref 3.6–11.0)

## 2016-08-10 LAB — COMPREHENSIVE METABOLIC PANEL
ALBUMIN: 2.7 g/dL — AB (ref 3.5–5.0)
ALT: 18 U/L (ref 14–54)
ANION GAP: 7 (ref 5–15)
AST: 30 U/L (ref 15–41)
Alkaline Phosphatase: 330 U/L — ABNORMAL HIGH (ref 38–126)
BUN: 7 mg/dL (ref 6–20)
CO2: 20 mmol/L — ABNORMAL LOW (ref 22–32)
CREATININE: 0.63 mg/dL (ref 0.44–1.00)
Calcium: 8.8 mg/dL — ABNORMAL LOW (ref 8.9–10.3)
Chloride: 106 mmol/L (ref 101–111)
GFR calc Af Amer: >60 mL/min (ref >60)
GFR calc non Af Amer: >60 mL/min (ref >60)
Glucose, Bld: 91 mg/dL (ref 65–99)
Potassium: 3.7 mmol/L (ref 3.5–5.1)
SODIUM: 133 mmol/L — AB (ref 135–145)
Total Bilirubin: 0.7 mg/dL (ref 0.3–1.2)
Total Protein: 5.7 g/dL — ABNORMAL LOW (ref 6.5–8.1)

## 2016-08-10 LAB — PROTEIN / CREATININE RATIO, URINE
CREATININE, URINE: 135 mg/dL
Protein Creatinine Ratio: 0.13 mg/mg{Cre} (ref 0.00–0.15)
Total Protein, Urine: 17 mg/dL

## 2016-08-10 LAB — TYPE AND SCREEN
ABO/RH(D): O POS
Antibody Screen: NEGATIVE

## 2016-08-10 LAB — GLUCOSE, CAPILLARY: GLUCOSE-CAPILLARY: 65 mg/dL (ref 65–99)

## 2016-08-10 MED ORDER — OXYTOCIN 10 UNIT/ML IJ SOLN
10.0000 [IU] | Freq: Once | INTRAMUSCULAR | Status: DC
  Filled 2016-08-10: qty 2

## 2016-08-10 MED ORDER — LIDOCAINE HCL (PF) 1 % IJ SOLN
30.0000 mL | INTRAMUSCULAR | Status: DC | PRN
  Filled 2016-08-10: qty 30

## 2016-08-10 MED ORDER — DINOPROSTONE 10 MG VA INST
10.0000 mg | VAGINAL_INSERT | Freq: Once | VAGINAL | Status: AC
  Administered 2016-08-10: 10 mg via VAGINAL
  Filled 2016-08-10: qty 1

## 2016-08-10 MED ORDER — LACTATED RINGERS IV SOLN: 500.0000 mL | INTRAVENOUS | Status: DC | PRN

## 2016-08-10 MED ORDER — OXYTOCIN BOLUS FROM INFUSION
500.0000 mL | Freq: Once | INTRAVENOUS | Status: AC
  Administered 2016-08-12: 500 mL via INTRAVENOUS

## 2016-08-10 MED ORDER — TERBUTALINE SULFATE 1 MG/ML IJ SOLN: 0.2500 mg | Freq: Once | INTRAMUSCULAR | Status: DC | PRN

## 2016-08-10 MED ORDER — LACTATED RINGERS IV SOLN
INTRAVENOUS | Status: DC
  Administered 2016-08-10: 1000 mL via INTRAVENOUS
  Administered 2016-08-11: 21:00:00 via INTRAVENOUS

## 2016-08-10 MED ORDER — OXYTOCIN 40 UNITS IN LACTATED RINGERS INFUSION - SIMPLE MED
2.5000 [IU]/h | INTRAVENOUS | Status: DC
  Administered 2016-08-12: 2.5 [IU]/h via INTRAVENOUS

## 2016-08-10 MED ORDER — ONDANSETRON HCL 4 MG/2ML IJ SOLN
4.0000 mg | Freq: Four times a day (QID) | INTRAMUSCULAR | Status: DC | PRN
  Administered 2016-08-12: 4 mg via INTRAVENOUS
  Filled 2016-08-10: qty 2

## 2016-08-10 MED ORDER — SOD CITRATE-CITRIC ACID 500-334 MG/5ML PO SOLN: 30.0000 mL | ORAL | Status: DC | PRN

## 2016-08-10 NOTE — OB Triage Note (Signed)
Sent over from office for [facility] evaluation. Pt. States she does have a headache but hasnt taken anything for it. Denies visual changes or epigastric pain. Slight swelling noted in ankles, reflexes 1+, no clonus.

## 2016-08-10 NOTE — H&P (Signed)
OB History & Physical   History of Present Illness:  Chief Complaint:  Dr Bonney Aid sent me over because my blood pressure was elevated. HPI:  Natasha Larson is a 36 y.o. [phone] female with EDC=08/28/2016 at [redacted]w[redacted]d dated by her LMP=13 week ultrasound.  Her pregnancy has been complicated by AMA (nl Informaseq, XY), GDMA2, and a history of preeclampsia with her second delivery. Natasha Larson has been non compliant with checking blood sugars, bringing a blood sugar log to her appts, and with diet. She is currently taking Metformin 500 mgm BID (never switched to glyburide).  She presents to L&D for evaluation of elevated blood pressures.. She reports mild frontal/orbital headaches that are sometimes accompanied by blurred vision. None currently.   Usually does not take analgesics. Denies CP, SOB, scotomata, nausea/vomiting or RUQ pain. Hx of SGA baby with last delivery. Total weight gain with pregnancy 11 #.   Prenatal care site: Prenatal care at [facility]. Last growth scan at 32 weeks EFW: 3#12oz (33%). AFI today 7.13cm with reactive NST. Placenta anterior and calcified. Stopped smoking around [redacted] weeks gestation. Received TDAP 06/27/2016. Desires to bottle feed. Wants BTL. Medicaid is secondary.  Review of Systems  Constitutional: Negative for chills, fever and weight loss.  HENT: Negative for congestion, sinus pain and sore throat.   Eyes: Positive for blurred vision (with some headaches). Negative for double vision and pain.       Negative for scotomata  Respiratory: Negative for hemoptysis, shortness of breath and wheezing.   Cardiovascular: Positive for leg swelling. Negative for chest pain and palpitations.  Gastrointestinal: Positive for heartburn. Negative for abdominal pain, blood in stool, diarrhea, nausea and vomiting.       Denies contractions  Genitourinary: Negative for dysuria, frequency, hematuria and urgency.       Denies vaginal bleeding or leakage of fluid  Musculoskeletal: Negative for back  pain, joint pain and myalgias.  Skin: Negative for itching and rash.  Neurological: Negative for dizziness and tingling. Headaches: frontal and orbital.  Endo/Heme/Allergies: Negative for environmental allergies and polydipsia. Does not bruise/bleed easily.       Negative for hirsutism   Psychiatric/Behavioral: Negative for depression. The patient is not nervous/anxious and does not have insomnia.       Maternal Medical History:   Past Medical History:  Diagnosis Date  . Advanced maternal age in multigravida   . Anemia   . Gestational diabetes   . History of prior pregnancy with SGA newborn   . Hx of preeclampsia, prior pregnancy, currently pregnant     Past Surgical History:  Procedure Laterality Date  . DILATION AND CURETTAGE OF UTERUS     x2 for SAB and missed AB    No Known Allergies  Prior to Admission medications   Metformin 500 mgm BID, Prenatal vitamins daily, Zantac 150 mgm BID prn heartburn   Social History: She  reports that she has quit smoking. She has never used smokeless tobacco. She reports that she does not drink alcohol or use drugs.  Family History: family history includes Breast cancer in her other; Heart failure in her mother; Hypertension in her mother.     Physical Exam:  Vital Signs:  Patient Vitals for the past 24 hrs:  BP Temp Temp src Pulse Resp Height Weight  08/10/16 1402 (!) 148/86 - - 78 18 - -  08/10/16 1347 (!) 149/88 - - 80 - - -  08/10/16 1332 (!) 149/93 - - 79 - - -  08/10/16  1317 (!) 145/94 - - 80 - - -  08/10/16 1302 139/87 - - 78 - - -  08/10/16 1249 (!) 161/09 - - 84 - - -  08/10/16 1232 131/77 - - 84 - - -  08/10/16 1217 138/87 - - 85 - - -  08/10/16 1207 134/75 98.2 F (36.8 C) Oral 84 16  (1.575 m) 169 lb (76.7 kg)  General: no acute distress.  HEENT: normocephalic, atraumatic Heart: regular rate & rhythm.  No murmurs Lungs: clear to auscultation bilaterally Abdomen: soft, gravid, non-tender;  EFW: 5 1/2#,  cephalic Extremities: non-tender, symmetric, trace edema bilaterally.  DTRs:   Neurologic: Alert & oriented x 3.    Pertinent Results:  Prenatal Labs: Blood type/Rh O positive  Antibody screen negative  Rubella Varicella Non immune immune  RPR Non reactive  HBsAg negative  HIV negative  GC negative  Chlamydia negative  Genetic screening Informasez neg, XY  1 hour GTT 174  3 hour GTT 87/181/192/105  GBS negative on 08/01/2016   Baseline FHR: 130s with accelerations to 150s to 160s, moderate variability Toco: occasional contraction, mild  Lab Results  Component Value Date   WBC 5.2 08/10/2016   HGB 11.9 (L) 08/10/2016   HCT 35.5 08/10/2016   PLT 161 08/10/2016   CREATININE 0.63 08/10/2016   ALT 18 08/10/2016   AST 30 08/10/2016   PROTCRRATIO 0.13 08/10/2016     Assessment:  Natasha Larson is a 36 y.o. [id number] female at [redacted]w[redacted]d with gestational hypertensino.  GDMA2 not well controlled  Plan:  1. Admit for delivery.  Will induce given gestational HTN and other comorbid conditions of pregnancy.  2. No evidence of severe features.  3. Consents obtained.  Thomasene Mohair  08/10/2016 2:47 PM

## 2016-08-10 NOTE — Progress Notes (Signed)
    Routine Prenatal Care Visit  Subjective  Fetal Movement? yes Contractions? no Leaking Fluid? no Vaginal Bleeding? No  Does reports headaches in the last week and currently, no vision changes, no RUQ or epigastric pain, no increased edema.  No glucose log, mother has been in hospital past week and she has "eaten out more".  States sugars have been running high but "not bad" 140's  Objective   Vitals:   08/10/16 0923  BP: (!) 144/92    @ Urine dipstick trace protein negative glucose  General: NAD Pumonary: no increased work of breathing Abdomen: gravid, non-tender, fundal height 35, fetal heart tones 130BPM Extremities: no edema Psychiatric: mood appropriate, affect full  Baseline: 130 Variability: moderate Accelerations: present Decelerations: absent Tocometry: N/A The patient was monitored for 30 minutes, fetal heart rate tracing was deemed reactive, category I tracing,  AFI today 7cm, note is made of calcifications and grade III placenta  Glucose log not with patient  Assessment   36 y.o. [id number] at [redacted]w[redacted]d by  08/28/2016, by Ultrasound presenting for routine prenatal visit  pregnancy #5 Problems (from 11/22/15 to present)    Problem Noted Resolved   AMA (advanced maternal age) multigravida 35+, third trimester 06/27/2016 by Vena Austria, MD No       Plan   Problem List Items Addressed This Visit      Endocrine   Oral hypoglycemic controlled White classification A2 gestational diabetes mellitus (GDM) - Primary   Relevant Orders   US OB Comp + 14 Wk     Other   AMA (advanced maternal age) multigravida 35+, third trimester   Relevant Orders   US OB Comp + 14 Wk   Hx of preeclampsia, prior pregnancy, currently pregnant, third trimester   Relevant Orders   US OB Comp + 14 Wk    Other Visit Diagnoses    Elevated blood pressure affecting pregnancy in third trimester, antepartum       Relevant Orders   US OB Comp + 14 Wk   [redacted] weeks  gestation of pregnancy       Relevant Orders   US OB Comp + 14 Wk     - No growth since 32 week growth scan will order growth scan 4 days - Given headaches, history of preeclampsia, elevated BP and non-compliance with GDM, grade III placenta will send to L&D for PIH work up and monitoring.  Discussed if BP remain elevated in setting of her GDM would proceed with IOL

## 2016-08-10 NOTE — Progress Notes (Signed)
NST/AFI today

## 2016-08-11 ENCOUNTER — Inpatient Hospital Stay: Payer: BLUE CROSS/BLUE SHIELD | Admitting: Anesthesiology

## 2016-08-11 ENCOUNTER — Encounter: Payer: Self-pay | Admitting: Anesthesiology

## 2016-08-11 LAB — GLUCOSE, CAPILLARY
GLUCOSE-CAPILLARY: 83 mg/dL (ref 65–99)
Glucose-Capillary: 96 mg/dL (ref 65–99)
Glucose-Capillary: 66 mg/dL (ref 65–99)
Glucose-Capillary: 103 mg/dL — ABNORMAL HIGH (ref 65–99)

## 2016-08-11 LAB — COMPREHENSIVE METABOLIC PANEL
ALT: 20 U/L (ref 14–54)
ANION GAP: 11 (ref 5–15)
AST: 30 U/L (ref 15–41)
Albumin: 2.9 g/dL — ABNORMAL LOW (ref 3.5–5.0)
Alkaline Phosphatase: 360 U/L — ABNORMAL HIGH (ref 38–126)
BUN: 6 mg/dL (ref 6–20)
CHLORIDE: 104 mmol/L (ref 101–111)
CO2: 18 mmol/L — ABNORMAL LOW (ref 22–32)
Calcium: 8.7 mg/dL — ABNORMAL LOW (ref 8.9–10.3)
Creatinine, Ser: 0.41 mg/dL — ABNORMAL LOW (ref 0.44–1.00)
GFR calc Af Amer: >60 mL/min (ref >60)
Glucose, Bld: 87 mg/dL (ref 65–99)
POTASSIUM: 3.3 mmol/L — AB (ref 3.5–5.1)
SODIUM: 133 mmol/L — AB (ref 135–145)
Total Bilirubin: 1 mg/dL (ref 0.3–1.2)
Total Protein: 6.4 g/dL — ABNORMAL LOW (ref 6.5–8.1)

## 2016-08-11 LAB — RPR: RPR Ser Ql: NONREACTIVE

## 2016-08-11 LAB — PLATELET COUNT: PLATELETS: 183 10*3/uL (ref 150–440)

## 2016-08-11 MED ORDER — FENTANYL 2.5 MCG/ML W/ROPIVACAINE 0.2% IN NS 100 ML EPIDURAL INFUSION (ARMC-ANES)
EPIDURAL | Status: DC | PRN
  Administered 2016-08-11: 9 mL/h via EPIDURAL

## 2016-08-11 MED ORDER — MAGNESIUM SULFATE BOLUS VIA INFUSION
4.0000 g | Freq: Once | INTRAVENOUS | Status: DC
  Filled 2016-08-11: qty 500

## 2016-08-11 MED ORDER — LACTATED RINGERS IV BOLUS (SEPSIS)
1000.0000 mL | Freq: Once | INTRAVENOUS | Status: AC
  Administered 2016-08-11: 1000 mL via INTRAVENOUS

## 2016-08-11 MED ORDER — MISOPROSTOL 200 MCG PO TABS
ORAL_TABLET | ORAL | Status: AC
  Filled 2016-08-11: qty 4

## 2016-08-11 MED ORDER — HYDRALAZINE HCL 20 MG/ML IJ SOLN
10.0000 mg | INTRAMUSCULAR | Status: DC | PRN
  Administered 2016-08-11 – 2016-08-12 (×2): 10 mg via INTRAVENOUS
  Filled 2016-08-11 (×3): qty 1
  Filled 2016-08-11: qty 0.5
  Filled 2016-08-11: qty 1

## 2016-08-11 MED ORDER — MAGNESIUM SULFATE 50 % IJ SOLN
2.0000 g/h | INTRAVENOUS | Status: DC
  Administered 2016-08-12: 2 g/h via INTRAVENOUS
  Filled 2016-08-11: qty 80

## 2016-08-11 MED ORDER — BUPIVACAINE HCL (PF) 0.25 % IJ SOLN
INTRAMUSCULAR | Status: DC | PRN
  Administered 2016-08-11: 10 mL via EPIDURAL

## 2016-08-11 MED ORDER — LIDOCAINE HCL (PF) 2 % IJ SOLN
INTRAMUSCULAR | Status: DC | PRN
  Administered 2016-08-11: 4 mL via INTRADERMAL

## 2016-08-11 MED ORDER — SODIUM CHLORIDE FLUSH 0.9 % IV SOLN
INTRAVENOUS | Status: AC
  Filled 2016-08-11: qty 20

## 2016-08-11 MED ORDER — TERBUTALINE SULFATE 1 MG/ML IJ SOLN: 0.2500 mg | Freq: Once | INTRAMUSCULAR | Status: DC | PRN

## 2016-08-11 MED ORDER — LIDOCAINE HCL (PF) 1 % IJ SOLN
INTRAMUSCULAR | Status: DC | PRN
  Administered 2016-08-11: 3 mL

## 2016-08-11 MED ORDER — MAGNESIUM SULFATE 4 GM/100ML IV SOLN
INTRAVENOUS | Status: AC
  Administered 2016-08-11: 4 g
  Filled 2016-08-11: qty 100

## 2016-08-11 MED ORDER — BUTORPHANOL TARTRATE 1 MG/ML IJ SOLN
1.0000 mg | INTRAMUSCULAR | Status: DC | PRN
  Administered 2016-08-11 (×2): 1 mg via INTRAVENOUS
  Filled 2016-08-11 (×2): qty 1

## 2016-08-11 MED ORDER — HYDRALAZINE HCL 20 MG/ML IJ SOLN
5.0000 mg | Freq: Once | INTRAMUSCULAR | Status: AC | PRN
  Administered 2016-08-11: 5 mg via INTRAVENOUS
  Filled 2016-08-11: qty 1

## 2016-08-11 MED ORDER — SODIUM CHLORIDE FLUSH 0.9 % IV SOLN
INTRAVENOUS | Status: AC
  Filled 2016-08-11: qty 50

## 2016-08-11 MED ORDER — LIDOCAINE-EPINEPHRINE (PF) 1.5 %-1:200000 IJ SOLN
INTRAMUSCULAR | Status: DC | PRN
  Administered 2016-08-11: 3 mL via PERINEURAL

## 2016-08-11 MED ORDER — OXYTOCIN 40 UNITS IN LACTATED RINGERS INFUSION - SIMPLE MED
1.0000 m[IU]/min | INTRAVENOUS | Status: DC
  Administered 2016-08-11: 1 m[IU]/min via INTRAVENOUS
  Filled 2016-08-11: qty 1000

## 2016-08-11 MED ORDER — AMMONIA AROMATIC IN INHA
RESPIRATORY_TRACT | Status: AC
  Filled 2016-08-11: qty 10

## 2016-08-11 MED ORDER — MAGNESIUM SULFATE 50 % IJ SOLN
2.0000 g/h | INTRAMUSCULAR | Status: DC
  Administered 2016-08-11: 2 g/h via INTRAVENOUS
  Filled 2016-08-11: qty 80

## 2016-08-11 MED ORDER — FENTANYL 2.5 MCG/ML W/ROPIVACAINE 0.2% IN NS 100 ML EPIDURAL INFUSION (ARMC-ANES)
EPIDURAL | Status: AC
  Filled 2016-08-11: qty 100

## 2016-08-11 NOTE — Progress Notes (Signed)
Subjective:  Doing well no concerns  Objective:   Vitals: Blood pressure 134/89, pulse 69, temperature 98.3 F (36.8 C), temperature source Oral, resp. rate 17, height  (1.575 m), weight 169 lb (76.7 kg), last menstrual period 11/22/2015. General:  Abdomen: Cervical Exam:  Dilation: 1 Effacement (%): 50 Cervical Position: Posterior Station: -3 Exam by:: M.Newton,RN  FHT: 130, moderate, +accels, no decels Toco: q5-28min  Results for orders placed or performed during the hospital encounter of 08/10/16 (from the past 24 hour(s))  Protein / creatinine ratio, urine     Status: None   Collection Time: 08/10/16 12:48 PM  Result Value Ref Range   Creatinine, Urine 135 mg/dL   Total Protein, Urine 17 mg/dL   Protein Creatinine Ratio 0.13 0.00 - 0.15 mg/mg[Cre]  Type and screen [facility]     Status: None   Collection Time: 08/10/16 12:55 PM  Result Value Ref Range   ABO/RH(D) O POS    Antibody Screen NEG    Sample Expiration 08/13/2016   CBC     Status: Abnormal   Collection Time: 08/10/16 12:55 PM  Result Value Ref Range   WBC 5.2 3.6 - 11.0 K/uL   RBC 4.29 3.80 - 5.20 MIL/uL   Hemoglobin 11.9 (L) 12.0 - 16.0 g/dL   HCT 16.1 09.6 - 04.5 %   MCV 82.7 80.0 - 100.0 fL   MCH 27.8 26.0 - 34.0 pg   MCHC 33.6 32.0 - 36.0 g/dL   RDW 40.9 81.1 - 91.4 %   Platelets 161 150 - 440 K/uL  Comprehensive metabolic panel     Status: Abnormal   Collection Time: 08/10/16 12:55 PM  Result Value Ref Range   Sodium 133 (L) 135 - 145 mmol/L   Potassium 3.7 3.5 - 5.1 mmol/L   Chloride 106 101 - 111 mmol/L   CO2 20 (L) 22 - 32 mmol/L   Glucose, Bld 91 65 - 99 mg/dL   BUN 7 6 - 20 mg/dL   Creatinine, Ser 7.82 0.44 - 1.00 mg/dL   Calcium 8.8 (L) 8.9 - 10.3 mg/dL   Total Protein 5.7 (L) 6.5 - 8.1 g/dL   Albumin 2.7 (L) 3.5 - 5.0 g/dL   AST 30 15 - 41 U/L   ALT 18 14 - 54 U/L   Alkaline Phosphatase 330 (H) 38 - 126 U/L   Total Bilirubin 0.7 0.3 - 1.2 mg/dL   GFR calc  non Af Amer >60 >60 mL/min   GFR calc Af Amer >60 >60 mL/min   Anion gap 7 5 - 15  Glucose, capillary     Status: None   Collection Time: 08/10/16  2:01 PM  Result Value Ref Range   Glucose-Capillary 65 65 - 99 mg/dL  RPR     Status: None   Collection Time: 08/10/16  2:57 PM  Result Value Ref Range   RPR Ser Ql Non Reactive Non Reactive    Assessment:   36 y.o. [id number] [redacted]w[redacted]d IOL GDM, GHTN, AMA  Plan:   1) Labor - will start pitocin post cervidil, once in contraction pattern will attempt to place cervical foley  2) Fetus - Cat I tracing  3) GHTN - monitor BP's  4) GDM - CBG q4-hrs latent phase, q2-hrs active phase

## 2016-08-11 NOTE — Anesthesia Preprocedure Evaluation (Signed)
Anesthesia Evaluation  Patient identified by MRN, date of birth, ID band Patient awake    Reviewed: Allergy & Precautions, NPO status , Patient's Chart, lab work & pertinent test results  Airway Mallampati: II  TM Distance: >3 FB     Dental no notable dental hx.    Pulmonary former smoker,    Pulmonary exam normal        Cardiovascular hypertension, Normal cardiovascular exam     Neuro/Psych negative neurological ROS  negative psych ROS   GI/Hepatic negative GI ROS, Neg liver ROS,   Endo/Other  diabetes, Well Controlled, Gestational  Renal/GU negative Renal ROS  negative genitourinary   Musculoskeletal negative musculoskeletal ROS (+)   Abdominal Normal abdominal exam  (+)   Peds negative pediatric ROS (+)  Hematology  (+) anemia ,   Anesthesia Other Findings Past Medical History: No date: Advanced maternal age in multigravida No date: Anemia No date: Gestational diabetes No date: History of prior pregnancy with SGA newborn No date: Hx of preeclampsia, prior pregnancy, currently*  Reproductive/Obstetrics                             Anesthesia Physical Anesthesia Plan  ASA: II  Anesthesia Plan: Epidural   Post-op Pain Management:    Induction:   Airway Management Planned: Natural Airway  Additional Equipment:   Intra-op Plan:   Post-operative Plan:   Informed Consent: I have reviewed the patients History and Physical, chart, labs and discussed the procedure including the risks, benefits and alternatives for the proposed anesthesia with the patient or authorized representative who has indicated his/her understanding and acceptance.     Plan Discussed with: CRNA  Anesthesia Plan Comments:         Anesthesia Quick Evaluation

## 2016-08-11 NOTE — Progress Notes (Signed)
Subjective:  Doing well, no concerns  Objective:   Vitals: Blood pressure (!) 140/96, pulse 66, temperature 98.3 F (36.8 C), temperature source Oral, resp. rate 16, height  (1.575 m), weight 169 lb (76.7 kg), last menstrual period 11/22/2015. General: NAD Abdomen: gravid, non-tender Cervical Exam: 05/05/68/-3 Cervical foley placed  FHT: 125, moderate, +accels, no decels Toco: q66min  Results for orders placed or performed during the hospital encounter of 08/10/16 (from the past 24 hour(s))  Glucose, capillary     Status: None   Collection Time: 08/11/16 10:38 AM  Result Value Ref Range   Glucose-Capillary 96 65 - 99 mg/dL  Glucose, capillary     Status: None   Collection Time: 08/11/16  3:05 PM  Result Value Ref Range   Glucose-Capillary 66 65 - 99 mg/dL    Assessment:   36 y.o. G4W1027 [redacted]w[redacted]d IOL GHTN, GDM, AMA  Plan:   1) Labor - foley placed, continue pitocin  2) Fetus - cat I tracing  3) GHTN - monitor BP's if severe range requring IV meds start mag  4) GDM continue accuchecks

## 2016-08-11 NOTE — Progress Notes (Signed)
Subjective:  Comfortable with epidural in place  Objective:   Vitals: Blood pressure (!) 109/48, pulse (!) 105, temperature 97.9 F (36.6 C), temperature source Oral, resp. rate 20, height  (1.575 m), weight 169 lb (76.7 kg), last menstrual period 11/22/2015, SpO2 93 %. General:  Abdomen: Cervical Exam:  Dilation: 7 Effacement (%): 90 Cervical Position: Posterior Station: -1 Presentation: Vertex Exam by:: AS  FHT: 115-120, moderate, no accels, late deceleration which resolved with O2 and position changes, + fetal scalp stim Toco: q63min  Results for orders placed or performed during the hospital encounter of 08/10/16 (from the past 24 hour(s))  Glucose, capillary     Status: None   Collection Time: 08/11/16 10:38 AM  Result Value Ref Range   Glucose-Capillary 96 65 - 99 mg/dL  Glucose, capillary     Status: None   Collection Time: 08/11/16  3:05 PM  Result Value Ref Range   Glucose-Capillary 66 65 - 99 mg/dL  Comprehensive metabolic panel     Status: Abnormal   Collection Time: 08/11/16  6:21 PM  Result Value Ref Range   Sodium 133 (L) 135 - 145 mmol/L   Potassium 3.3 (L) 3.5 - 5.1 mmol/L   Chloride 104 101 - 111 mmol/L   CO2 18 (L) 22 - 32 mmol/L   Glucose, Bld 87 65 - 99 mg/dL   BUN 6 6 - 20 mg/dL   Creatinine, Ser 7.61 (L) 0.44 - 1.00 mg/dL   Calcium 8.7 (L) 8.9 - 10.3 mg/dL   Total Protein 6.4 (L) 6.5 - 8.1 g/dL   Albumin 2.9 (L) 3.5 - 5.0 g/dL   AST 30 15 - 41 U/L   ALT 20 14 - 54 U/L   Alkaline Phosphatase 360 (H) 38 - 126 U/L   Total Bilirubin 1.0 0.3 - 1.2 mg/dL   GFR calc non Af Amer >60 >60 mL/min   GFR calc Af Amer >60 >60 mL/min   Anion gap 11 5 - 15  Glucose, capillary     Status: None   Collection Time: 08/11/16  6:22 PM  Result Value Ref Range   Glucose-Capillary 83 65 - 99 mg/dL  Platelet count     Status: None   Collection Time: 08/11/16  7:42 PM  Result Value Ref Range   Platelets 183 150 - 440 K/uL  Glucose, capillary     Status:  Abnormal   Collection Time: 08/11/16 10:04 PM  Result Value Ref Range   Glucose-Capillary 103 (H) 65 - 99 mg/dL    Assessment:   36 y.o. [id number] [redacted]w[redacted]d IOL preeclampsia with severe features, GDM, AMA  Plan:   1) Labor - continue pitocin  2) Fetus - category II tracing continue to monitor, last BP is also significantly lower than prior BP's, 1L LR bolus  3) Preeclampsia with severe features - based on BP criteria, continue magnesium sulfate  4) GDM - q2hr accuchecks

## 2016-08-11 NOTE — Progress Notes (Signed)
Subjective:  Painfully contracting  Objective:   Vitals: Blood pressure (!) 150/73, pulse 88, temperature 98.3 F (36.8 C), temperature source Oral, resp. rate 16, height  (1.575 m), weight 169 lb (76.7 kg), last menstrual period 11/22/2015. General: NAD Abdomen: gravid, non-tender Cervical Exam: 4-5/80/-2  FHT: 120. Moderate, no accels, no decels Toco:q1-44min  Results for orders placed or performed during the hospital encounter of 08/10/16 (from the past 24 hour(s))  Glucose, capillary     Status: None   Collection Time: 08/11/16 10:38 AM  Result Value Ref Range   Glucose-Capillary 96 65 - 99 mg/dL  Glucose, capillary     Status: None   Collection Time: 08/11/16  3:05 PM  Result Value Ref Range   Glucose-Capillary 66 65 - 99 mg/dL  Comprehensive metabolic panel     Status: Abnormal   Collection Time: 08/11/16  6:21 PM  Result Value Ref Range   Sodium 133 (L) 135 - 145 mmol/L   Potassium 3.3 (L) 3.5 - 5.1 mmol/L   Chloride 104 101 - 111 mmol/L   CO2 18 (L) 22 - 32 mmol/L   Glucose, Bld 87 65 - 99 mg/dL   BUN 6 6 - 20 mg/dL   Creatinine, Ser 1.32 (L) 0.44 - 1.00 mg/dL   Calcium 8.7 (L) 8.9 - 10.3 mg/dL   Total Protein 6.4 (L) 6.5 - 8.1 g/dL   Albumin 2.9 (L) 3.5 - 5.0 g/dL   AST 30 15 - 41 U/L   ALT 20 14 - 54 U/L   Alkaline Phosphatase 360 (H) 38 - 126 U/L   Total Bilirubin 1.0 0.3 - 1.2 mg/dL   GFR calc non Af Amer >60 >60 mL/min   GFR calc Af Amer >60 >60 mL/min   Anion gap 11 5 - 15  Glucose, capillary     Status: None   Collection Time: 08/11/16  6:22 PM  Result Value Ref Range   Glucose-Capillary 83 65 - 99 mg/dL    Assessment:   36 y.o. G4W1027 [redacted]w[redacted]d IOL preeclampsia with severe features, GDM, AMA  Plan:   1) Labor - will cut pitocin in half, platelet count pending  2) Fetus - cat I tracing  3) GDM - start q2-hr accuchecks  4) Preeclampsia with severe features - based on severe range BP's requiring IV hydralazine.  Labs normal including P/C  ratio - continue magnesium sulfate

## 2016-08-11 NOTE — Anesthesia Procedure Notes (Signed)
Epidural Patient location during procedure: OB Start time: 08/11/2016 8:37 PM End time: 08/11/2016 8:51 PM  Staffing Anesthesiologist: Yves Dill Performed: anesthesiologist   Preanesthetic Checklist Completed: patient identified, site marked, surgical consent, pre-op evaluation, timeout performed, IV checked, risks and benefits discussed and monitors and equipment checked  Epidural Patient position: sitting Prep: Betadine Patient monitoring: heart rate, continuous pulse ox, blood pressure and cardiac monitor Approach: midline Location: L3-L4 Injection technique: LOR air  Needle:  Needle type: Tuohy  Needle gauge: 18 G Needle length: 9 cm and 9 Catheter type: closed end Catheter size: 20 Guage Test dose: negative and 1.5% lidocaine with Epi 1:200 K  Assessment Sensory level: T8 Events: blood not aspirated, injection not painful, no injection resistance, negative IV test and no paresthesia  Additional Notes Time out called.  Patient placed in sitting position.  Back prepped and draped in sterile fashion.  A skin wheal was made in the L3-L4 interspace with 1% Lidocaine plain. An 18G Tuohy needle was guided into the epidural space by a loss of resistance technique.  An epidural catheter was threaded into the space approximately 3 cm with a negative TD.  No blood , paresthesias or fluid noted.  Patient tolerated the procedure well.  The catheter was affixed to the back in sterile fashion.Reason for block:procedure for pain

## 2016-08-11 NOTE — Progress Notes (Signed)
Inpatient Diabetes Program Recommendations  AACE/ADA: New Consensus Statement on Inpatient Glycemic Control (2015)  Target Ranges:  Prepandial:   less than 140 mg/dL      Peak postprandial:   less than 180 mg/dL (1-2 hours)      Critically ill patients:  140 - 180 mg/dL   Lab Results  Component Value Date   GLUCAP 65 08/10/2016    Review of Glycemic Control  Results for KARLYN, GLASCO (MRN [id number]) as of 08/11/2016 07:48  Ref. Range 08/10/2016 14:01  Glucose-Capillary Latest Ref Range: 65 - 99 mg/dL 65    Diabetes history: Diabetes during pregnancy Outpatient Diabetes medications: Metformin  1 bid Current orders for Inpatient glycemic control: none  Inpatient Diabetes Program Recommendations:  Please consider ordering CBG tid/hs.  Consider changing diet to carb modified.   Susette Racer, RN, BA, MHA, CDE Diabetes Coordinator Inpatient Diabetes Program  [phone] (Team Pager) [phone] [facility] Office) 08/11/2016 7:58 AM

## 2016-08-12 DIAGNOSIS — Z3A37 37 weeks gestation of pregnancy: Secondary | ICD-10-CM

## 2016-08-12 DIAGNOSIS — O1494 Unspecified pre-eclampsia, complicating childbirth: Secondary | ICD-10-CM

## 2016-08-12 LAB — CBC
HCT: 33.4 % — ABNORMAL LOW (ref 35.0–47.0)
Hemoglobin: 11.1 g/dL — ABNORMAL LOW (ref 12.0–16.0)
MCH: 27.2 pg (ref 26.0–34.0)
MCHC: 33.1 g/dL (ref 32.0–36.0)
MCV: 82.3 fL (ref 80.0–100.0)
PLATELETS: 165 10*3/uL (ref 150–440)
RBC: 4.06 MIL/uL (ref 3.80–5.20)
RDW: 14 % (ref 11.5–14.5)
WBC: 24 10*3/uL — AB (ref 3.6–11.0)

## 2016-08-12 LAB — GLUCOSE, CAPILLARY: GLUCOSE-CAPILLARY: 118 mg/dL — AB (ref 65–99)

## 2016-08-12 MED ORDER — DIPHENHYDRAMINE HCL 25 MG PO CAPS: 25.0000 mg | ORAL_CAPSULE | Freq: Four times a day (QID) | ORAL | Status: DC | PRN

## 2016-08-12 MED ORDER — EPHEDRINE 5 MG/ML INJ: 10.0000 mg | INTRAVENOUS | Status: DC | PRN

## 2016-08-12 MED ORDER — ACETAMINOPHEN 325 MG PO TABS: 650.0000 mg | ORAL_TABLET | ORAL | Status: DC | PRN

## 2016-08-12 MED ORDER — IBUPROFEN 600 MG PO TABS
ORAL_TABLET | ORAL | Status: AC
  Administered 2016-08-12: 600 mg via ORAL
  Filled 2016-08-12: qty 1

## 2016-08-12 MED ORDER — ONDANSETRON HCL 4 MG/2ML IJ SOLN
4.0000 mg | INTRAMUSCULAR | Status: DC | PRN
Start: 2016-08-12 — End: 2016-08-13

## 2016-08-12 MED ORDER — PRENATAL MULTIVITAMIN CH
1.0000 | ORAL_TABLET | Freq: Every day | ORAL | Status: DC
  Administered 2016-08-12 – 2016-08-13 (×2): 1 via ORAL
  Filled 2016-08-12 (×3): qty 1

## 2016-08-12 MED ORDER — BENZOCAINE-MENTHOL 20-0.5 % EX AERO: 1.0000 "application " | INHALATION_SPRAY | CUTANEOUS | Status: DC | PRN

## 2016-08-12 MED ORDER — SIMETHICONE 80 MG PO CHEW: 80.0000 mg | CHEWABLE_TABLET | ORAL | Status: DC | PRN

## 2016-08-12 MED ORDER — HYDRALAZINE HCL 20 MG/ML IJ SOLN
5.0000 mg | Freq: Once | INTRAMUSCULAR | Status: AC | PRN
  Administered 2016-08-12: 5 mg via INTRAVENOUS

## 2016-08-12 MED ORDER — SODIUM CHLORIDE 0.9% FLUSH
3.0000 mL | Freq: Three times a day (TID) | INTRAVENOUS | Status: DC
  Administered 2016-08-13: 3 mL via INTRAVENOUS

## 2016-08-12 MED ORDER — FENTANYL 2.5 MCG/ML W/ROPIVACAINE 0.2% IN NS 100 ML EPIDURAL INFUSION (ARMC-ANES): 9.0000 mL/h | EPIDURAL | Status: DC

## 2016-08-12 MED ORDER — ONDANSETRON HCL 4 MG PO TABS: 4.0000 mg | ORAL_TABLET | ORAL | Status: DC | PRN

## 2016-08-12 MED ORDER — DIPHENHYDRAMINE HCL 50 MG/ML IJ SOLN: 12.5000 mg | INTRAMUSCULAR | Status: DC | PRN

## 2016-08-12 MED ORDER — IBUPROFEN 600 MG PO TABS
600.0000 mg | ORAL_TABLET | Freq: Four times a day (QID) | ORAL | Status: DC
  Administered 2016-08-12 – 2016-08-13 (×5): 600 mg via ORAL
  Filled 2016-08-12 (×4): qty 1

## 2016-08-12 MED ORDER — DIBUCAINE 1 % RE OINT: 1.0000 "application " | TOPICAL_OINTMENT | RECTAL | Status: DC | PRN

## 2016-08-12 MED ORDER — OXYCODONE-ACETAMINOPHEN 5-325 MG PO TABS: 2.0000 | ORAL_TABLET | ORAL | Status: DC | PRN

## 2016-08-12 MED ORDER — COCONUT OIL OIL: 1.0000 "application " | TOPICAL_OIL | Status: DC | PRN

## 2016-08-12 MED ORDER — TETANUS-DIPHTH-ACELL PERTUSSIS 5-2.5-18.5 LF-MCG/0.5 IM SUSP: 0.5000 mL | Freq: Once | INTRAMUSCULAR | Status: DC

## 2016-08-12 MED ORDER — LABETALOL HCL 200 MG PO TABS
200.0000 mg | ORAL_TABLET | Freq: Two times a day (BID) | ORAL | Status: DC
  Administered 2016-08-12 – 2016-08-13 (×3): 200 mg via ORAL
  Filled 2016-08-12 (×3): qty 1

## 2016-08-12 MED ORDER — LACTATED RINGERS IV SOLN: 500.0000 mL | Freq: Once | INTRAVENOUS | Status: DC

## 2016-08-12 MED ORDER — PHENYLEPHRINE 40 MCG/ML (10ML) SYRINGE FOR IV PUSH (FOR BLOOD PRESSURE SUPPORT)
80.0000 ug | PREFILLED_SYRINGE | INTRAVENOUS | Status: DC | PRN
Start: 2016-08-12 — End: 2016-08-12

## 2016-08-12 MED ORDER — WITCH HAZEL-GLYCERIN EX PADS: 1.0000 "application " | MEDICATED_PAD | CUTANEOUS | Status: DC | PRN

## 2016-08-12 MED ORDER — OXYCODONE-ACETAMINOPHEN 5-325 MG PO TABS: 1.0000 | ORAL_TABLET | ORAL | Status: DC | PRN

## 2016-08-12 MED ORDER — PHENYLEPHRINE 40 MCG/ML (10ML) SYRINGE FOR IV PUSH (FOR BLOOD PRESSURE SUPPORT): 80.0000 ug | PREFILLED_SYRINGE | INTRAVENOUS | Status: DC | PRN

## 2016-08-12 MED ORDER — LACTATED RINGERS IV SOLN
INTRAVENOUS | Status: DC
  Administered 2016-08-12: 18:00:00 via INTRAVENOUS

## 2016-08-12 MED ORDER — SENNOSIDES-DOCUSATE SODIUM 8.6-50 MG PO TABS
2.0000 | ORAL_TABLET | ORAL | Status: DC
  Administered 2016-08-12: 2 via ORAL
  Filled 2016-08-12: qty 2

## 2016-08-12 NOTE — Discharge Summary (Signed)
Obstetric Discharge Summary Reason for Admission: induction of labor and preeclampsia with severe features Prenatal Procedures: none, Preeclampsia and magnesium sulfate Intrapartum Procedures: spontaneous vaginal delivery Postpartum Procedures: Rubella Ig and PP Magnesium for preeclampsia Complications-Operative and Postpartum: none Hemoglobin  Date Value Ref Range Status  08/12/2016 11.1 (L) 12.0 - 16.0 g/dL Final  16/01/9603 54.0 g/dL Final   HCT  Date Value Ref Range Status  08/12/2016 33.4 (L) 35.0 - 47.0 % Final  02/22/2016 31 % Final    Physical Exam:  AF, BP 120-140/70-80s  (BP max 141/88 last 24 hours)  P80 General: alert, cooperative and no distress Chest clear Heart reg Lochia: appropriate Uterine Fundus: firm Incision: none DVT Evaluation: No evidence of DVT seen on physical exam. Negative Homan's sign.  Discharge Diagnoses: Term Pregnancy-delivered, Gestational Hypertension with Preeclampsia  Discharge Information: Date: 08/13/2016 Activity: pelvic rest Diet: routine Allergies as of 08/13/2016   No Known Allergies     Medication List    STOP taking these medications   ACCU-CHEK FASTCLIX LANCETS Misc   ACCU-CHEK GUIDE test strip Generic drug:  glucose blood   metFORMIN 500 MG tablet Commonly known as:  GLUCOPHAGE     TAKE these medications   labetalol 200 MG tablet Commonly known as:  NORMODYNE Take 1 tablet (200 mg total) by mouth 2 (two) times daily.   PROVIDA OB 20-20-1.25 MG Caps Take 1 capsule by mouth daily.   ranitidine 150 MG tablet Commonly known as:  ZANTAC Take 1 tablet (150 mg total) by mouth 2 (two) times daily.      Condition: stable Discharge to: home Follow-up Information    Vena Austria, MD Follow up in 1 week(s).   Specialty:  Obstetrics and Gynecology Why:  for BP CHECK Contact information: [address] [place] [address] [phone]           Newborn Data: Live born unspecified sex  Birth  Weight:  5-8 APGAR: 8, 9  Home with mother.  Letitia Libra 08/13/2016, 10:11 AM

## 2016-08-12 NOTE — H&P (Signed)
Subjective:  Feeling like she has to push  Objective:   Vitals: Blood pressure (!) 109/48, pulse (!) 105, temperature 98.1 F (36.7 C), temperature source Oral, resp. rate 20, height  (1.575 m), weight 169 lb (76.7 kg), last menstrual period 11/22/2015, SpO2 95 %. General:  NAD Abdomen: Gravid, non-tender Cervical Exam:  Dilation: 8 Effacement (%): 100 Cervical Position: Posterior Station: 0 Presentation: Vertex Exam by:: AS  FHT: 120, moderate, no accels, occasional earlies Toco:q3-73min  Results for orders placed or performed during the hospital encounter of 08/10/16 (from the past 24 hour(s))  Glucose, capillary     Status: None   Collection Time: 08/11/16 10:38 AM  Result Value Ref Range   Glucose-Capillary 96 65 - 99 mg/dL  Glucose, capillary     Status: None   Collection Time: 08/11/16  3:05 PM  Result Value Ref Range   Glucose-Capillary 66 65 - 99 mg/dL  Comprehensive metabolic panel     Status: Abnormal   Collection Time: 08/11/16  6:21 PM  Result Value Ref Range   Sodium 133 (L) 135 - 145 mmol/L   Potassium 3.3 (L) 3.5 - 5.1 mmol/L   Chloride 104 101 - 111 mmol/L   CO2 18 (L) 22 - 32 mmol/L   Glucose, Bld 87 65 - 99 mg/dL   BUN 6 6 - 20 mg/dL   Creatinine, Ser 1.61 (L) 0.44 - 1.00 mg/dL   Calcium 8.7 (L) 8.9 - 10.3 mg/dL   Total Protein 6.4 (L) 6.5 - 8.1 g/dL   Albumin 2.9 (L) 3.5 - 5.0 g/dL   AST 30 15 - 41 U/L   ALT 20 14 - 54 U/L   Alkaline Phosphatase 360 (H) 38 - 126 U/L   Total Bilirubin 1.0 0.3 - 1.2 mg/dL   GFR calc non Af Amer >60 >60 mL/min   GFR calc Af Amer >60 >60 mL/min   Anion gap 11 5 - 15  Glucose, capillary     Status: None   Collection Time: 08/11/16  6:22 PM  Result Value Ref Range   Glucose-Capillary 83 65 - 99 mg/dL  Platelet count     Status: None   Collection Time: 08/11/16  7:42 PM  Result Value Ref Range   Platelets 183 150 - 440 K/uL  Glucose, capillary     Status: Abnormal   Collection Time: 08/11/16 10:04 PM  Result  Value Ref Range   Glucose-Capillary 103 (H) 65 - 99 mg/dL    Assessment:   36 y.o. [id number] [redacted]w[redacted]d IOL preeclampsia with severe features, GDM, AM  Plan:   1) Labor - continue pitocin  2) Fetus - cat I tracing

## 2016-08-12 NOTE — Progress Notes (Signed)
Pt doing well. Min pain.  Denies h/a, blurry vision, epig pain, CP, SOB Diuresing well BPs mostly 120-130s/70-80s Will cont Labetalol;  Stop Magnesium;  Monitor BPs and s/sx preeclampsia

## 2016-08-12 NOTE — Progress Notes (Signed)
Admit Date: 08/10/2016 Today's Date: 08/12/2016  Post Partum Day 0 - s/p SVD with Preeclampsia, on pp Magnesium  Subjective:  Min pain or lochia; denies h/a, blurry vision, CP, SOB, edema.    Objective: Temp:  [97.9 F (36.6 C)-98.8 F (37.1 C)] 98.8 F (37.1 C) (04/14 0750) Pulse Rate:  [58-115] 102 (04/14 0845) Resp:  [16-20] 18 (04/14 0750) BP: (109-187)/(48-121) 120/59 (04/14 0845) SpO2:  [93 %-97 %] 95 % (04/14 0200) Weight:  [169 lb (76.7 kg)] 169 lb (76.7 kg) (04/13 1900) BPc 120/59.  BPmax 187/85  Physical Exam:  General: alert, cooperative and no distress Lochia: appropriate Uterine Fundus: firm Incision: none DVT Evaluation: No evidence of DVT seen on physical exam.   Recent Labs  08/10/16 1255  HGB 11.9*  HCT 35.5    Assessment/Plan: Contraception desires BTL and will wait for interval lap tubal due to recovery from preeclampsia currently (advised against tubal this admission), Bottle Feeding and Infant doing well Monitor diuresis (already doing well) and serial BPs Labetalol 200 mg BID has been started this am Hydralazine one dose given earlier, BPs currently in normal range   LOS: 2 days   Letitia Libra [facility] 08/12/2016, 9:06 AM

## 2016-08-13 MED ORDER — MEASLES, MUMPS & RUBELLA VAC ~~LOC~~ INJ
0.5000 mL | INJECTION | Freq: Once | SUBCUTANEOUS | Status: DC
  Filled 2016-08-13: qty 0.5

## 2016-08-13 MED ORDER — LABETALOL HCL 200 MG PO TABS: 200.0000 mg | ORAL_TABLET | Freq: Two times a day (BID) | ORAL | 2 refills | Status: DC

## 2016-08-13 NOTE — Progress Notes (Signed)
Admit Date: 08/10/2016 Today's Date: 08/13/2016  Post Partum Day 1 - s/p SVD with Preeclampsia, on pp Magnesium  Subjective:  Min pain or lochia; denies h/a, blurry vision, CP, SOB, edema.    Ready for discharge  Objective: Temp:  [98 F (36.7 C)-98.5 F (36.9 C)] 98.3 F (36.8 C) (04/15 0757) Pulse Rate:  [87-106] 89 (04/15 0757) Resp:  [16-20] 18 (04/15 0757) BP: (124-142)/(59-96) 142/77 (04/15 0757) SpO2:  [97 %-100 %] 99 % (04/15 0757) BPc 120/59.  BPmax 187/85  Physical Exam:  General: alert, cooperative and no distress Lochia: appropriate Uterine Fundus: firm Incision: none DVT Evaluation: No evidence of DVT seen on physical exam.   Recent Labs  08/10/16 1255 08/12/16 0829  HGB 11.9* 11.1*  HCT 35.5 33.4*    Assessment/Plan: Contraception desires BTL and will wait for interval lap tubal due to recovery from preeclampsia currently (advised against tubal this admission), Bottle Feeding and Infant doing well Labetalol 200 mg BID has been started this am MMR Discharge w meds and close f/u   LOS: 3 days   [place] 08/13/2016, 10:14 AM

## 2016-08-13 NOTE — Progress Notes (Signed)
Discharge order received from doctor. MMR and Tdap vaccines offered (VIS given). Patient declined both vaccines. Reviewed discharge instructions and prescriptions with patient and answered all questions. Follow up appointment instructions given. Patient verbalized understanding. ID bands checked. Patient discharged home with infant via wheelchair by nursing/auxillary.    Hilbert Bible, RN

## 2016-08-13 NOTE — Discharge Instructions (Signed)
Please call your doctor or return to the ER if you experience any chest pains, shortness of breath, headache, visual changes, fever greater than 101, any heavy bleeding (saturating more than 1 pad per hour), large clots, or foul smelling discharge, any worsening abdominal pain and cramping that is not controlled by pain medication, or any signs of postpartum depression. No tampons, enemas, douches, or sexual intercourse for 6 weeks. Also avoid tub baths, hot tubs, or swimming for 6 weeks.    Vaginal Delivery, Care After Refer to this sheet in the next few weeks. These instructions provide you with information about caring for yourself after vaginal delivery. Your health care provider may also give you more specific instructions. Your treatment has been planned according to current medical practices, but problems sometimes occur. Call your health care provider if you have any problems or questions. What can I expect after the procedure? After vaginal delivery, it is common to have:  Some bleeding from your vagina.  Soreness in your abdomen, your vagina, and the area of skin between your vaginal opening and your anus (perineum).  Pelvic cramps.  Fatigue. Follow these instructions at home: Medicines   Take over-the-counter and prescription medicines only as told by your health care provider.  If you were prescribed an antibiotic medicine, take it as told by your health care provider. Do not stop taking the antibiotic until it is finished. Driving    Do not drive or operate heavy machinery while taking prescription pain medicine.  Do not drive for 24 hours if you received a sedative. Lifestyle   Do not drink alcohol. This is especially important if you are breastfeeding or taking medicine to relieve pain.  Do not use tobacco products, including cigarettes, chewing tobacco, or e-cigarettes. If you need help quitting, ask your health care provider. Eating and drinking   Drink at least 8  eight-ounce glasses of water every day unless you are told not to by your health care provider. If you choose to breastfeed your baby, you may need to drink more water than this.  Eat high-fiber foods every day. These foods may help prevent or relieve constipation. High-fiber foods include:  Whole grain cereals and breads.  Brown rice.  Beans.  Fresh fruits and vegetables. Activity   Return to your normal activities as told by your health care provider. Ask your health care provider what activities are safe for you.  Rest as much as possible. Try to rest or take a nap when your baby is sleeping.  Do not lift anything that is heavier than your baby or 10 lb (4.5 kg) until your health care provider says that it is safe.  Talk with your health care provider about when you can engage in sexual activity. This may depend on your:  Risk of infection.  Rate of healing.  Comfort and desire to engage in sexual activity. Vaginal Care   If you have an episiotomy or a vaginal tear, check the area every day for signs of infection. Check for:  More redness, swelling, or pain.  More fluid or blood.  Warmth.  Pus or a bad smell.  Do not use tampons or douches until your health care provider says this is safe.  Watch for any blood clots that may pass from your vagina. These may look like clumps of dark red, brown, or black discharge. General instructions   Keep your perineum clean and dry as told by your health care provider.  Wear loose, comfortable clothing.  Wipe from front to back when you use the toilet.  Ask your health care provider if you can shower or take a bath. If you had an episiotomy or a perineal tear during labor and delivery, your health care provider may tell you not to take baths for a certain length of time.  Wear a bra that supports your breasts and fits you well.  If possible, have someone help you with household activities and help care for your baby for at  least a few days after you leave the hospital.  Keep all follow-up visits for you and your baby as told by your health care provider. This is important. Contact a health care provider if:  You have:  Vaginal discharge that has a bad smell.  Difficulty urinating.  Pain when urinating.  A sudden increase or decrease in the frequency of your bowel movements.  More redness, swelling, or pain around your episiotomy or vaginal tear.  More fluid or blood coming from your episiotomy or vaginal tear.  Pus or a bad smell coming from your episiotomy or vaginal tear.  A fever.  A rash.  Little or no interest in activities you used to enjoy.  Questions about caring for yourself or your baby.  Your episiotomy or vaginal tear feels warm to the touch.  Your episiotomy or vaginal tear is separating or does not appear to be healing.  Your breasts are painful, hard, or turn red.  You feel unusually sad or worried.  You feel nauseous or you vomit.  You pass large blood clots from your vagina. If you pass a blood clot from your vagina, save it to show to your health care provider. Do not flush blood clots down the toilet without having your health care provider look at them.  You urinate more than usual.  You are dizzy or light-headed.  You have not breastfed at all and you have not had a menstrual period for 12 weeks after delivery.  You have stopped breastfeeding and you have not had a menstrual period for 12 weeks after you stopped breastfeeding. Get help right away if:  You have:  Pain that does not go away or does not get better with medicine.  Chest pain.  Difficulty breathing.  Blurred vision or spots in your vision.  Thoughts about hurting yourself or your baby.  You develop pain in your abdomen or in one of your legs.  You develop a severe headache.  You faint.  You bleed from your vagina so much that you fill two sanitary pads in one hour. This information is  not intended to replace advice given to you by your health care provider. Make sure you discuss any questions you have with your health care provider. Document Released: 04/14/2000 Document Revised: 09/29/2015 Document Reviewed: 05/02/2015 Elsevier Interactive Patient Education  2017 [employer].

## 2016-08-13 NOTE — Anesthesia Postprocedure Evaluation (Signed)
Anesthesia Post Note  Patient: Natasha Larson  Procedure(s) Performed: * No procedures listed *  Patient location during evaluation: Mother Baby Anesthesia Type: Epidural Level of consciousness: awake and alert and oriented Pain management: pain level controlled Vital Signs Assessment: post-procedure vital signs reviewed and stable Respiratory status: spontaneous breathing, nonlabored ventilation and respiratory function stable Cardiovascular status: stable Postop Assessment: no headache, no backache, epidural receding and no signs of nausea or vomiting (no pruritis) Anesthetic complications: no     Last Vitals:  Vitals:   08/13/16 0757 08/13/16 1148  BP: (!) 142/77 130/75  Pulse: 89 84  Resp: 18 18  Temp: 36.8 C 36.7 C    Last Pain:  Vitals:   08/13/16 1148  TempSrc: Oral  PainSc:                  Benjamin Casanas

## 2016-08-14 ENCOUNTER — Other Ambulatory Visit: Payer: BLUE CROSS/BLUE SHIELD

## 2016-08-14 ENCOUNTER — Encounter: Payer: BLUE CROSS/BLUE SHIELD | Admitting: Obstetrics and Gynecology

## 2016-08-22 ENCOUNTER — Ambulatory Visit (INDEPENDENT_AMBULATORY_CARE_PROVIDER_SITE_OTHER): Payer: BLUE CROSS/BLUE SHIELD | Admitting: Obstetrics & Gynecology

## 2016-08-22 VITALS — BP 140/80 | Ht 62.0 in | Wt 149.0 lb

## 2016-08-22 DIAGNOSIS — O139 Gestational [pregnancy-induced] hypertension without significant proteinuria, unspecified trimester: Secondary | ICD-10-CM | POA: Diagnosis not present

## 2016-08-22 NOTE — Progress Notes (Signed)
  History of Present Illness:  Natasha Larson is a 36 y.o. who was started on  Current Outpatient Prescriptions on File Prior to Visit  Medication Sig Dispense Refill  . labetalol (NORMODYNE) 200 MG tablet Take 1 tablet (200 mg total) by mouth 2 (two) times daily. 60 tablet 2  . Prenat w/o A Vit-FeFum-FePo-FA (PROVIDA OB) 20-20-1.25 MG CAPS Take 1 capsule by mouth daily.  12  . ranitidine (ZANTAC) 150 MG tablet Take 1 tablet (150 mg total) by mouth 2 (two) times daily. 60 tablet 11   approximately 1 weeks ago. Since that time, she states that her symptoms are improving.  No s/sx preeclampsia.  No home BP monitoring to report.  PMHx: She  has a past medical history of Advanced maternal age in multigravida; Anemia; Gestational diabetes; History of prior pregnancy with SGA newborn; and preeclampsia, prior pregnancy, currently pregnant. Also,  has a past surgical history that includes Dilation and curettage of uterus., family history includes Breast cancer in her other; Heart failure in her mother; Hypertension in her mother.,  reports that she has quit smoking. She has never used smokeless tobacco. She reports that she does not drink alcohol or use drugs.  She has a current medication list which includes the following prescription(s): labetalol, provida ob, and ranitidine. Also, has No Known Allergies.  Review of Systems  Constitutional: Negative for chills, fever and malaise/fatigue.  HENT: Negative for congestion, sinus pain and sore throat.   Eyes: Negative for blurred vision and pain.  Respiratory: Negative for cough and wheezing.   Cardiovascular: Negative for chest pain and leg swelling.  Gastrointestinal: Negative for abdominal pain, constipation, diarrhea, heartburn, nausea and vomiting.  Genitourinary: Negative for dysuria, frequency, hematuria and urgency.  Musculoskeletal: Negative for back pain, joint pain, myalgias and neck pain.  Skin: Negative for itching and rash.  Neurological:  Negative for dizziness, tremors and weakness.  Endo/Heme/Allergies: Does not bruise/bleed easily.  Psychiatric/Behavioral: Negative for depression. The patient is not nervous/anxious and does not have insomnia.     Physical Exam:  BP 140/80   Ht  (1.575 m)   Wt 149 lb (67.6 kg)   BMI 27.25 kg/m  Body mass index is 27.25 kg/m. Constitutional: Well nourished, well developed female in no acute distress.  Abdomen: diffusely non tender to palpation, non distended, and no masses, hernias Neuro: Grossly intact Psych:  Normal mood and affect.    Assessment: Medication treatment is going very well for her HTN PP.  Plan: She will undergo no change in her medical therapy.  She was amenable to this plan and we will see her back for 6 week check, also to have preop for Lap BTL then.  Annamarie Major, MD, [facility], [facility] 08/22/2016  3:43 PM

## 2016-08-22 NOTE — Patient Instructions (Signed)
Laparoscopic Tubal Ligation Laparoscopic tubal ligation is a procedure to close the fallopian tubes. This is done so that you cannot get pregnant. When the fallopian tubes are closed, the eggs that your ovaries release cannot enter the uterus, and sperm cannot reach the released eggs. A laparoscopic tubal ligation is sometimes called "getting your tubes tied." You should not have this procedure if you want to get pregnant someday or if you are unsure about having more children. Tell a health care provider about:  Any allergies you have.  All medicines you are taking, including vitamins, herbs, eye drops, creams, and over-the-counter medicines.  Any problems you or family members have had with anesthetic medicines.  Any blood disorders you have.  Any surgeries you have had.  Any medical conditions you have.  Whether you are pregnant or may be pregnant.  Any past pregnancies. What are the risks? Generally, this is a safe procedure. However, problems may occur, including:  Infection.  Bleeding.  Injury to surrounding organs.  Side effects from anesthetics.  Failure of the procedure. This procedure can increase your risk of a kind of pregnancy in which a fertilized egg attaches to the outside of the uterus (ectopic pregnancy). What happens before the procedure?  Ask your health care provider about:  Changing or stopping your regular medicines. This is especially important if you are taking diabetes medicines or blood thinners.  Taking medicines such as aspirin and ibuprofen. These medicines can thin your blood. Do not take these medicines before your procedure if your health care provider instructs you not to.  Follow instructions from your health care provider about eating and drinking restrictions.  Plan to have someone take you home after the procedure.  If you go home right after the procedure, plan to have someone with you for 24 hours. What happens during the  procedure?  You will be given one or more of the following:  A medicine to help you relax (sedative).  A medicine to numb the area (local anesthetic).  A medicine to make you fall asleep (general anesthetic).  A medicine that is injected into an area of your body to numb everything below the injection site (regional anesthetic).  An IV tube will be inserted into one of your veins. It will be used to give you medicines and fluids during the procedure.  Your bladder may be emptied with a small tube (catheter).  If you have been given a general anesthetic, a tube will be put down your throat to help you breathe.  Two small cuts (incisions) will be made in your lower abdomen and near your belly button.  Your abdomen will be inflated with a gas. This will let the surgeon see better and will give the surgeon room to work.  A thin, lighted tube (laparoscope) with a camera attached will be inserted into your abdomen through one of the incisions. Small instruments will be inserted through the other incision.  The fallopian tubes will be tied off, burned (cauterized), or blocked with a clip, ring, or clamp. A small portion in the center of each fallopian tube may be removed.  The gas will be released from the abdomen.  The incisions will be closed with stitches (sutures).  A bandage (dressing) will be placed over the incisions. The procedure may vary among health care providers and hospitals. What happens after the procedure?  Your blood pressure, heart rate, breathing rate, and blood oxygen level will be monitored often until the medicines you were   given have worn off.  You will be given medicine to help with pain, nausea, and vomiting as needed. This information is not intended to replace advice given to you by your health care provider. Make sure you discuss any questions you have with your health care provider. Document Released: 07/24/2000 Document Revised: 09/23/2015 Document  Reviewed: 03/28/2015 Elsevier Interactive Patient Education  2017 [employer]  

## 2016-08-23 ENCOUNTER — Telehealth: Payer: Self-pay | Admitting: Obstetrics & Gynecology

## 2016-08-23 NOTE — Telephone Encounter (Signed)
-----   Message from Nadara Mustard, MD sent at 08/22/2016  3:52 PM EDT ----- Regarding: Lap BTL Surgery Booking Request Patient Full Name: @  MRN: [id number]  DOB: 11-Sep-1980  Surgeon: Letitia Libra, MD  Requested Surgery Date and Time: May 31 Primary Diagnosis and Code: Sterilization Secondary Diagnosis and Code:  Surgical Procedure: Lap BTL L&D Notification: No Admission Status: same day surgery Length of Surgery: Special Case Needs: none H&P:   (date) Phone Interview or Office Pre-Admit: ok Interpreter: no Language: e Medical Clearance: no Special Scheduling Instructions: no

## 2016-08-23 NOTE — Telephone Encounter (Signed)
Patient is aware of H&P during 6 wk check on 09/20/16 @ 10:30am w/ Pre-Admit phone interview afterwards, and OR on 09/28/16. Patient will come by the office today or tomorrow, depending on the weather, to sign a consent to sterilization form with Jessica P.

## 2016-09-08 DIAGNOSIS — M654 Radial styloid tenosynovitis [de Quervain]: Secondary | ICD-10-CM | POA: Diagnosis not present

## 2016-09-08 DIAGNOSIS — M25532 Pain in left wrist: Secondary | ICD-10-CM | POA: Diagnosis not present

## 2016-09-08 DIAGNOSIS — M25531 Pain in right wrist: Secondary | ICD-10-CM | POA: Diagnosis not present

## 2016-09-08 DIAGNOSIS — M778 Other enthesopathies, not elsewhere classified: Secondary | ICD-10-CM | POA: Diagnosis not present

## 2016-09-20 ENCOUNTER — Ambulatory Visit (INDEPENDENT_AMBULATORY_CARE_PROVIDER_SITE_OTHER): Payer: BLUE CROSS/BLUE SHIELD | Admitting: Obstetrics and Gynecology

## 2016-09-20 ENCOUNTER — Encounter
Admission: RE | Admit: 2016-09-20 | Discharge: 2016-09-20 | Disposition: A | Discharge status: Home / Self Care | Payer: BLUE CROSS/BLUE SHIELD | Source: Ambulatory Visit | Attending: Obstetrics and Gynecology | Admitting: Obstetrics and Gynecology

## 2016-09-20 DIAGNOSIS — Z302 Encounter for sterilization: Secondary | ICD-10-CM

## 2016-09-20 DIAGNOSIS — O1410 Severe pre-eclampsia, unspecified trimester: Secondary | ICD-10-CM

## 2016-09-20 DIAGNOSIS — O24429 Gestational diabetes mellitus in childbirth, unspecified control: Secondary | ICD-10-CM

## 2016-09-20 DIAGNOSIS — O1414 Severe pre-eclampsia complicating childbirth: Secondary | ICD-10-CM

## 2016-09-20 HISTORY — DX: Essential (primary) hypertension: I10

## 2016-09-20 NOTE — Patient Instructions (Signed)
  Your procedure is scheduled on: 09-28-16 THURSDAY Report to Same Day Surgery 2nd floor medical mall [facility](Medical Mall Entrance-take elevator on left to 2nd floor.  Check in with surgery information desk.) To find out your arrival time please call [phone] between 1PM - 3PM [facility]  Remember: Instructions that are not followed completely may result in serious medical risk, up to and including death, or upon the discretion of your surgeon and anesthesiologist your surgery may need to be rescheduled.    _x___ 1. Do not eat food or drink liquids after midnight. No gum chewing or hard candies.     __x__ 2. No Alcohol for 24 hours before or after surgery.   __x__3. No Smoking for 24 prior to surgery.   ____  4. Bring all medications with you on the day of surgery if instructed.    __x__ 5. Notify your doctor if there is any change in your medical condition     (cold, fever, infections).     Do not wear jewelry, make-up, hairpins, clips or nail polish.  Do not wear lotions, powders, or perfumes. You may wear deodorant.  Do not shave 48 hours prior to surgery. Men may shave face and neck.  Do not bring valuables to the hospital.    [facility] belongings or valuables.               Contacts, dentures or bridgework may not be worn into surgery.  Leave your suitcase in the car. After surgery it may be brought to your room.  For patients admitted to the hospital, discharge time is determined by your treatment team.   Patients discharged the day of surgery will not be allowed to drive home.  You will need someone to drive you home and stay with you the night of your procedure.    Please read over the following fact sheets that you were given:    _x___ TAKE THE FOLLOWING MEDICATION THE MORNING OF SURGERY WITH A SMALL SIP OF WATER. These include:  1. LABETALOL  2.  3.  4.  5.  6.  ____Fleets enema or Magnesium Citrate as directed.   _x___ Use CHG  Soap or sage wipes as directed on instruction sheet   ____ Use inhalers on the day of surgery and bring to hospital day of surgery  ____ Stop Metformin and Janumet 2 days prior to surgery.    ____ Take 1/2 of usual insulin dose the night before surgery and none on the morning surgery.   ____ Follow recommendations from Cardiologist, Pulmonologist or PCP regarding stopping Aspirin, Coumadin, Pllavix ,Eliquis, Effient, or Pradaxa, and Pletal.  X____Stop Anti-inflammatories such as Advil, Aleve, IBUPROFEN, Motrin, Naproxen, Naprosyn, Goodies powders or aspirin products NOW-OK to take Tylenol   ____ Stop supplements until after surgery.  But may continue Vitamin D, Vitamin B,       and multivitamin.   ____ Bring C-Pap to the hospital.

## 2016-09-20 NOTE — Progress Notes (Signed)
Postpartum Visit  Chief Complaint:  Chief Complaint  Patient presents with  . Postpartum Care    History of Present Illness: Patient is a 36 y.o. [id number] presents for postpartum visit.  Date of delivery: 08/12/16 Type of delivery: Vaginal delivery - Vacuum or forceps assisted  no Episiotomy No.  Laceration: no  Pregnancy or labor problems:  Severe preeclampsia, GDM, obesity Any problems since the delivery:  no  Newborn Details:  SINGLETON :  1. Baby's Birth weight: 5lbs 8.2oz Maternal Details:  Breast Feeding:  no Post partum depression/anxiety noted:  no   Patient is a 36 y.o. [id number] presenting for scheduled laparoscopic BTL, for the treatment or further evaluation of undesired fertility.   Review of Systems: Review of Systems  Constitutional: Negative for chills and fever.  HENT: Negative for congestion.   Respiratory: Negative for cough and shortness of breath.   Cardiovascular: Negative for chest pain and palpitations.  Gastrointestinal: Negative for abdominal pain, constipation, diarrhea, heartburn, nausea and vomiting.  Genitourinary: Negative for dysuria, frequency and urgency.  Skin: Negative for itching and rash.  Neurological: Negative for dizziness and headaches.  Endo/Heme/Allergies: Negative for polydipsia.  Psychiatric/Behavioral: Negative for depression.    Past Medical History:  Past Medical History:  Diagnosis Date  . Advanced maternal age in multigravida   . Anemia   . Gestational diabetes   . History of prior pregnancy with SGA newborn   . Hx of preeclampsia, prior pregnancy, currently pregnant     Past Surgical History:  Past Surgical History:  Procedure Laterality Date  . DILATION AND CURETTAGE OF UTERUS     x2 for SAB and missed AB    Family History:  Family History  Problem Relation Age of Onset  . Heart failure Mother   . Hypertension Mother   . Breast cancer Other     Social History:  Social History   Social History    . Marital status: Single    Spouse name: N/A  . Number of children: N/A  . Years of education: N/A   Occupational History  . Not on file.   Social History Main Topics  . Smoking status: Former Games developermoker  . Smokeless tobacco: Never Used  . Alcohol use No  . Drug use: No  . Sexual activity: Yes    Birth control/ protection: Surgical   Other Topics Concern  . Not on file   Social History Narrative  . No narrative on file    Allergies:  No Known Allergies  Medications: Prior to Admission medications   Medication Sig Start Date End Date Taking? Authorizing Provider  ibuprofen (ADVIL,MOTRIN) 200 MG tablet Take 400 mg by mouth every 6 (six) hours as needed for moderate pain.    [provider]  labetalol (NORMODYNE) 200 MG tablet Take 1 tablet (200 mg total) by mouth 2 (two) times daily. 08/13/16   Nadara MustardHarris, Robert P, MD  Prenat w/o A Vit-FeFum-FePo-FA (PROVIDA OB) 20-20-1.25 MG CAPS Take 1 capsule by mouth daily. 06/05/16   [provider]  ranitidine (ZANTAC) 150 MG tablet Take 1 tablet (150 mg total) by mouth 2 (two) times daily. Patient not taking: Reported on 09/15/2016 06/27/16   Vena AustriaStaebler, Edell Mesenbrink, MD    Physical Exam Vitals:  Vitals:   09/20/16 1100  BP: 120/82  Pulse: 87    General: NAD HEENT: normocephalic, anicteric Pulmonary: CTAB, No increased work of breathing Cardiovascular: RRR Abdomen: NABS, soft, non-tender, non-distended.  Umbilicus without lesions.  No hepatomegaly,  splenomegaly or masses palpable. No evidence of hernia.  Genitourinary:  External: Normal external female genitalia.  Normal urethral meatus, normal  Bartholin's and Skene's glands.    Vagina: Normal vaginal mucosa, no evidence of prolapse.    Cervix: Grossly normal in appearance, no bleeding  Uterus: Non-enlarged, mobile, normal contour.  No CMT  Adnexa: ovaries non-enlarged, no adnexal masses  Rectal: deferred Extremities: no edema, erythema, or tenderness Neurologic:  Grossly intact Psychiatric: mood appropriate, affect full  Assessment: 36 y.o. [name] presenting for 6 week postpartum visit, preop BTL  Plan: Problem List Items Addressed This Visit    None    Visit Diagnoses    6 weeks postpartum follow-up    -  Primary   Encounter for tubal ligation       Gestational diabetes mellitus, delivered       Hypertension in pregnancy, preeclampsia, severe, delivered           1) Contraception Education given regarding options for contraception, including plans on BTL.  2)  Pap - ASCCP guidelines and rational discussed. OBTAINED TODAY  3) Patient underwent screening for postpartum depression with  No concerns noted.  4) 36 y.o. [id number]  with undesired fertility, desires permanent sterilization.  Other reversible forms of contraception were discussed with patient; she declines all other modalities. Permanent nature of as well as associated risks of the procedure discussed with patient including but not limited to: risk of regret, permanence of method, bleeding, infection, injury to surrounding organs and need for additional procedures.  Failure risk of 0.5-1% with increased risk of ectopic gestation if pregnancy occurs was also discussed with patient.     5) Routine postoperative instructions were reviewed with the patient and her family in detail today including the expected length of recovery and likely postoperative course.  The patient concurred with the proposed plan, giving informed written consent for the surgery today.  Patient instructed on the importance of being NPO after midnight prior to her procedure.  If warranted preoperative prophylactic antibiotics and SCDs ordered on call to the OR to meet SCIP guidelines and adhere to recommendation laid forth in ACOG Practice Bulletin Number 104 May 2009  "Antibiotic Prophylaxis for Gynecologic Procedures".  - continue labetalol for now given surgery next week

## 2016-09-22 LAB — PAPIG, HPV, RFX 16/18
HPV, HIGH-RISK: NEGATIVE
PAP SMEAR COMMENT: 0

## 2016-09-23 ENCOUNTER — Other Ambulatory Visit: Payer: Self-pay | Admitting: Obstetrics and Gynecology

## 2016-09-26 ENCOUNTER — Encounter: Payer: Self-pay | Admitting: Obstetrics and Gynecology

## 2016-09-26 ENCOUNTER — Telehealth: Payer: Self-pay | Admitting: Obstetrics and Gynecology

## 2016-09-26 ENCOUNTER — Encounter
Admission: RE | Admit: 2016-09-26 | Discharge: 2016-09-26 | Disposition: A | Discharge status: Home / Self Care | Payer: BLUE CROSS/BLUE SHIELD | Source: Ambulatory Visit | Attending: Obstetrics and Gynecology | Admitting: Obstetrics and Gynecology

## 2016-09-26 DIAGNOSIS — Z539 Procedure and treatment not carried out, unspecified reason: Secondary | ICD-10-CM | POA: Diagnosis not present

## 2016-09-26 DIAGNOSIS — I10 Essential (primary) hypertension: Secondary | ICD-10-CM | POA: Diagnosis not present

## 2016-09-26 DIAGNOSIS — Z302 Encounter for sterilization: Secondary | ICD-10-CM | POA: Diagnosis not present

## 2016-09-26 LAB — COMPREHENSIVE METABOLIC PANEL
ALT: 17 U/L (ref 14–54)
ANION GAP: 7 (ref 5–15)
AST: 22 U/L (ref 15–41)
Albumin: 4 g/dL (ref 3.5–5.0)
Alkaline Phosphatase: 93 U/L (ref 38–126)
BUN: 10 mg/dL (ref 6–20)
CHLORIDE: 106 mmol/L (ref 101–111)
CO2: 23 mmol/L (ref 22–32)
CREATININE: 0.59 mg/dL (ref 0.44–1.00)
Calcium: 9.2 mg/dL (ref 8.9–10.3)
Glucose, Bld: 104 mg/dL — ABNORMAL HIGH (ref 65–99)
POTASSIUM: 4 mmol/L (ref 3.5–5.1)
SODIUM: 136 mmol/L (ref 135–145)
Total Bilirubin: 0.5 mg/dL (ref 0.3–1.2)
Total Protein: 7.3 g/dL (ref 6.5–8.1)

## 2016-09-26 LAB — HEMOGLOBIN: Hemoglobin: 12.2 g/dL (ref 12.0–16.0)

## 2016-09-26 NOTE — Telephone Encounter (Signed)
Patient sent over from pre-admit, has surgery with AMS Thursday- She took DayQuil on 5/28, none since, was told to make sure AMS knew this was taken.

## 2016-09-26 NOTE — Telephone Encounter (Signed)
Please advise if you are renewing this Rx. I wasn't sure wheat to do with it since you discontinued it.

## 2016-09-28 ENCOUNTER — Encounter
Admission: RE | Disposition: A | Discharge status: Home / Self Care | Payer: Self-pay | Source: Ambulatory Visit | Attending: Obstetrics and Gynecology

## 2016-09-28 ENCOUNTER — Encounter: Payer: Self-pay | Admitting: *Deleted

## 2016-09-28 ENCOUNTER — Encounter: Payer: Self-pay | Admitting: Anesthesiology

## 2016-09-28 ENCOUNTER — Ambulatory Visit
Admission: RE | Admit: 2016-09-28 | Discharge: 2016-09-28 | Disposition: A | Discharge status: Home / Self Care | Payer: BLUE CROSS/BLUE SHIELD | Source: Ambulatory Visit | Attending: Obstetrics and Gynecology | Admitting: Obstetrics and Gynecology

## 2016-09-28 ENCOUNTER — Telehealth: Payer: Self-pay | Admitting: Obstetrics and Gynecology

## 2016-09-28 DIAGNOSIS — Z539 Procedure and treatment not carried out, unspecified reason: Secondary | ICD-10-CM | POA: Insufficient documentation

## 2016-09-28 DIAGNOSIS — Z302 Encounter for sterilization: Secondary | ICD-10-CM | POA: Insufficient documentation

## 2016-09-28 SURGERY — LIGATION, FALLOPIAN TUBE, LAPAROSCOPIC
Anesthesia: Choice | Laterality: Bilateral

## 2016-09-28 MED ORDER — FAMOTIDINE 20 MG PO TABS
ORAL_TABLET | ORAL | Status: AC
  Filled 2016-09-28: qty 1

## 2016-09-28 MED ORDER — BUPIVACAINE HCL (PF) 0.5 % IJ SOLN
INTRAMUSCULAR | Status: AC
  Filled 2016-09-28: qty 30

## 2016-09-28 MED ORDER — FENTANYL CITRATE (PF) 100 MCG/2ML IJ SOLN
INTRAMUSCULAR | Status: AC
  Filled 2016-09-28: qty 2

## 2016-09-28 MED ORDER — PROPOFOL 10 MG/ML IV BOLUS
INTRAVENOUS | Status: AC
  Filled 2016-09-28: qty 20

## 2016-09-28 MED ORDER — MIDAZOLAM HCL 2 MG/2ML IJ SOLN
INTRAMUSCULAR | Status: AC
  Filled 2016-09-28: qty 2

## 2016-09-28 MED ORDER — ONDANSETRON HCL 4 MG/2ML IJ SOLN
INTRAMUSCULAR | Status: AC
  Filled 2016-09-28: qty 2

## 2016-09-28 SURGICAL SUPPLY — 19 items
BLADE SURG SZ11 CARB STEEL (BLADE) ×2 IMPLANT
CATH ROBINSON RED A/P 16FR (CATHETERS) ×2 IMPLANT
CHLORAPREP W/TINT 26ML (MISCELLANEOUS) ×2 IMPLANT
DERMABOND ADVANCED (GAUZE/BANDAGES/DRESSINGS) ×1
DERMABOND ADVANCED .7 DNX12 (GAUZE/BANDAGES/DRESSINGS) ×1 IMPLANT
DRSG TEGADERM 2-3/8X2-3/4 SM (GAUZE/BANDAGES/DRESSINGS) ×2 IMPLANT
GLOVE BIO SURGEON STRL SZ7 (GLOVE) ×2 IMPLANT
GLOVE INDICATOR 7.5 STRL GRN (GLOVE) ×2 IMPLANT
GOWN STRL REUS W/ TWL LRG LVL3 (GOWN DISPOSABLE) ×2 IMPLANT
GOWN STRL REUS W/TWL LRG LVL3 (GOWN DISPOSABLE) ×2
KIT RM TURNOVER CYSTO AR (KITS) ×2 IMPLANT
LABEL OR SOLS (LABEL) ×2 IMPLANT
NS IRRIG 500ML POUR BTL (IV SOLUTION) ×2 IMPLANT
PACK GYN LAPAROSCOPIC (MISCELLANEOUS) ×2 IMPLANT
PAD OB MATERNITY 4.3X12.25 (PERSONAL CARE ITEMS) ×2 IMPLANT
PAD PREP 24X41 OB/GYN DISP (PERSONAL CARE ITEMS) ×2 IMPLANT
SUT MNCRL AB 4-0 PS2 18 (SUTURE) ×2 IMPLANT
TROCAR XCEL NON-BLD 5MMX100MML (ENDOMECHANICALS) ×2 IMPLANT
TUBING INSUFFLATOR HI FLOW (MISCELLANEOUS) ×2 IMPLANT

## 2016-09-28 NOTE — Telephone Encounter (Signed)
Pt states she was schedule for a tubal ligation with AMS and wasn't able to go through with it. Pt is schedule for Post op apptoinemt 10/05/16 with AMS. But do to her not having that procedure done we switch her post op to an Follow up. Please advise.

## 2016-09-28 NOTE — Telephone Encounter (Signed)
Lmtrc

## 2016-09-29 NOTE — Telephone Encounter (Signed)
Pt has a follow up appointment with AMS on 6/7. Task sent to AMS

## 2016-09-29 NOTE — Telephone Encounter (Signed)
Does not need postpartum until we check a 2-hr glucose tolerance to see if she is diabeteic outside of pregnancy

## 2016-09-29 NOTE — Telephone Encounter (Signed)
Per patient, she would like to see Dr Bonney AidStaebler to discuss other birth control options, and the 10/05/16 appointment is fine.

## 2016-10-05 ENCOUNTER — Ambulatory Visit (INDEPENDENT_AMBULATORY_CARE_PROVIDER_SITE_OTHER): Payer: BLUE CROSS/BLUE SHIELD | Admitting: Obstetrics and Gynecology

## 2016-10-05 ENCOUNTER — Encounter: Payer: Self-pay | Admitting: Obstetrics and Gynecology

## 2016-10-05 VITALS — BP 140/82 | HR 73 | Wt 158.0 lb

## 2016-10-05 DIAGNOSIS — Z3009 Encounter for other general counseling and advice on contraception: Secondary | ICD-10-CM

## 2016-10-06 NOTE — Progress Notes (Signed)
Obstetrics & Gynecology Office Visit   Chief Complaint:  Chief Complaint  Patient presents with  . Contraception    Discuss birthcontrol options    History of Present Illness: 36 year old [id number] who was scheduled for BTL last week but had seconds thoughts and opted out of surgery.  She is still interested in contraception, does not plan on any further pregnancies, but is hesitant to proceed with anesthesia.  We discussed the relative safety of anesthesia and laparoscopic tubal ligation, but also spent time discussing alternative options particularly LARC's.    Review of Systems: 10 point review of systems negative unless otherwise noted in HPI  Past Medical History:  Past Medical History:  Diagnosis Date  . Advanced maternal age in multigravida   . Anemia   . GERD (gastroesophageal reflux disease)   . Gestational diabetes    gestational  . History of prior pregnancy with SGA newborn   . Hx of preeclampsia, prior pregnancy, currently pregnant   . Hypertension     Past Surgical History:  Past Surgical History:  Procedure Laterality Date  . DILATION AND CURETTAGE OF UTERUS     x2 for SAB and missed AB    Gynecologic History: Patient's last menstrual period was 09/18/2016.  Obstetric History: [id number]  Family History:  Family History  Problem Relation Age of Onset  . Heart failure Mother   . Hypertension Mother   . Breast cancer Other     Social History:  Social History   Social History  . Marital status: Single    Spouse name: N/A  . Number of children: N/A  . Years of education: N/A   Occupational History  . Not on file.   Social History Main Topics  . Smoking status: Current Every Day Smoker    Years: 13.00    Types: Cigarettes  . Smokeless tobacco: Never Used     Comment: 2 cig daily  . Alcohol use No  . Drug use: No  . Sexual activity: Yes    Birth control/ protection: Surgical   Other Topics Concern  . Not on file   Social History  Narrative  . No narrative on file    Allergies:  No Known Allergies  Medications: Prior to Admission medications   Medication Sig Start Date End Date Taking? Authorizing Provider  labetalol (NORMODYNE) 200 MG tablet Take 1 tablet (200 mg total) by mouth 2 (two) times daily. 08/13/16  Yes Nadara Mustard, MD  Prenat w/o A Vit-FeFum-FePo-FA (PROVIDA OB) 20-20-1.25 MG CAPS Take 1 capsule by mouth daily. 06/05/16  Yes [provider]    Physical Exam Vitals:  Vitals:   10/05/16 1136  BP: 140/82  Pulse: 73   Patient's last menstrual period was 09/18/2016.  General: NAD HEENT: normocephalic, anicteric Pulmonary: No increased work of breathing Neurologic: Grossly intact Psychiatric: mood appropriate, affect full  Assessment: 36 y.o. [id number] contraception consult  Plan: Problem List Items Addressed This Visit    None    Visit Diagnoses    Contraceptive education    -  Primary     1) Reviewed all forms of birth control options available including abstinence; over the counter/barrier methods; hormonal contraceptive medication including pill, patch, ring, injection,contraceptive implant; hormonal and nonhormonal IUDs; permanent sterilization options including vasectomy and the various tubal sterilization modalities. Risks and benefits reviewed.  Questions were answered.  Information was given to patient to review.   2) Patient plans IUD, and will schedule soon.  Risks and benefits of IUD discussed including the risks of irregular bleeding, cramping, infection, malpositioning, expulsion or uterine perforation of the IUD (1:1000 placements)  which may require further procedures such as laparoscopy.  IUDs while effective at preventing pregnancy do not prevent transmission of sexually transmitted diseases and use of barrier methods for this purpose was discussed.  Low overall incidence of failure with 99.7% efficacy rate in typical use.  The patient has not contraindication to IUD  placement.  3) A total of 15 minutes were spent in face-to-face contact with the patient during this encounter with over half of that time devoted to counseling and coordination of care.

## 2016-10-19 ENCOUNTER — Telehealth: Payer: Self-pay | Admitting: Obstetrics and Gynecology

## 2016-10-19 ENCOUNTER — Encounter: Payer: Self-pay | Admitting: Obstetrics and Gynecology

## 2016-10-19 NOTE — Telephone Encounter (Signed)
Pt is requesting to be brought in with Dr. Bonney AidStaebler for Mirena insertion tomorrow 10/20/16. Pt refuses to come in with Different provider.Please advise over booking. Pt is currently schedule Monday, 10/23/16 with Dr. Bonney AidStaebler. Pt believe cycle with be off by then. .Marland Kitchen

## 2016-10-19 NOTE — Telephone Encounter (Signed)
Advised Natasha Larson provider specifically states pt must be on cycle, then it is ok as long as pt is within 1st 10 days of starting menses. Message to Selena BattenKim to verify AMS preference. Likely ok to keep apt 10/23/16 & not r/s/overbook.

## 2016-10-19 NOTE — Telephone Encounter (Signed)
Pt is schedule 10/23/16 with AMS for mirena insertion.

## 2016-10-19 NOTE — Telephone Encounter (Signed)
Pt aware ok to wait until her originally scheduled appointment on 6/25. As long as she is within 10 days of cycle she is fine for insertion. KJ CMA

## 2016-10-20 NOTE — Telephone Encounter (Signed)
As long as within first 10 days we're good

## 2016-10-20 NOTE — Telephone Encounter (Signed)
Monday if that's when I have an opening

## 2016-10-20 NOTE — Telephone Encounter (Signed)
Pt f/u on Mirena insertion apt request. Pt states it will work better for her to come today if AMS can fit her in.

## 2016-10-20 NOTE — Telephone Encounter (Signed)
Please advise 

## 2016-10-20 NOTE — Telephone Encounter (Signed)
Pt aware, via voicemail, no openings today. Keep appointment for Monday

## 2016-10-23 ENCOUNTER — Ambulatory Visit (INDEPENDENT_AMBULATORY_CARE_PROVIDER_SITE_OTHER): Payer: BLUE CROSS/BLUE SHIELD | Admitting: Obstetrics and Gynecology

## 2016-10-23 ENCOUNTER — Encounter: Payer: Self-pay | Admitting: Obstetrics and Gynecology

## 2016-10-23 VITALS — BP 128/82 | HR 78 | Ht 62.0 in | Wt 158.0 lb

## 2016-10-23 DIAGNOSIS — Z30430 Encounter for insertion of intrauterine contraceptive device: Secondary | ICD-10-CM

## 2016-10-23 NOTE — Telephone Encounter (Signed)
Mirena stock reserved for this patient. 

## 2016-10-23 NOTE — Progress Notes (Signed)
       GYNECOLOGY OFFICE PROCEDURE NOTE  Natasha Larson is a 36 y.o. [phone] here for Mirena IUD insertion. No GYN concerns.   The patient is currently using condoms for contraception and her LMP is Patient's last menstrual period was 10/18/2016 (exact date)..  The indication for her IUD is contraception/cycle control.  IUD Insertion Procedure Note Patient identified, informed consent performed, consent signed.   Discussed risks of irregular bleeding, cramping, infection, malpositioning, expulsion or uterine perforation of the IUD (1:1000 placements)  which may require further procedure such as laparoscopy.  IUD while effective at preventing pregnancy do not prevent transmission of sexually transmitted diseases and use of barrier methods for this purpose was discussed. Time out was performed.  Urine pregnancy test negative.  Speculum placed in the vagina.  Cervix visualized.  Cleaned with Betadine x 2.  Grasped anteriorly with a single tooth tenaculum.  Uterus sounded to 8.5cm. IUD placed per manufacturer's recommendations.  Strings trimmed to 3 cm. Tenaculum was removed, good hemostasis noted.  Patient tolerated procedure well.   Patient was given post-procedure instructions.  She was advised to have backup contraception for one week.  Patient was also asked to check IUD strings periodically and follow up in 6 weeks for IUD check.  [name], MD, Merlinda FrederickFACOG [facility], [facility]

## 2016-10-26 ENCOUNTER — Other Ambulatory Visit: Payer: Self-pay

## 2016-10-26 MED ORDER — PRENATAL VITAMINS 0.8 MG PO TABS: 1.0000 | ORAL_TABLET | Freq: Every day | ORAL | 12 refills | Status: DC

## 2016-10-26 NOTE — Telephone Encounter (Signed)
Received fax from pharmacy that provida OB prenatal viatmins on manufacturer back order. Pt aware and will send in generic prenatal vitamin.

## 2016-10-29 HISTORY — PX: INTRAUTERINE DEVICE (IUD) INSERTION: SHX5877

## 2016-12-04 ENCOUNTER — Ambulatory Visit (INDEPENDENT_AMBULATORY_CARE_PROVIDER_SITE_OTHER): Payer: BLUE CROSS/BLUE SHIELD | Admitting: Obstetrics and Gynecology

## 2016-12-04 ENCOUNTER — Encounter: Payer: Self-pay | Admitting: Obstetrics and Gynecology

## 2016-12-04 VITALS — BP 136/86 | HR 85 | Wt 159.0 lb

## 2016-12-04 DIAGNOSIS — Z30431 Encounter for routine checking of intrauterine contraceptive device: Secondary | ICD-10-CM | POA: Diagnosis not present

## 2016-12-04 NOTE — Progress Notes (Signed)
Obstetrics & Gynecology Office Visit   Chief Complaint:  Chief Complaint  Patient presents with  . Contraception    spotting    History of Present Illness: 36 y.o. patient presenting for follow up of Mirena IUD placement 6+ weeks ago.  She denies any complications since her IUD placement.  Still having some occasional spotting.  is able to feel strings.    Review of Systems: Review of Systems  Constitutional: Negative for chills and fever.  Gastrointestinal: Negative for abdominal pain, nausea and vomiting.  Psychiatric/Behavioral: Negative for depression.    Past Medical History:  Past Medical History:  Diagnosis Date  . Advanced maternal age in multigravida   . Anemia   . GERD (gastroesophageal reflux disease)   . Gestational diabetes    gestational  . History of prior pregnancy with SGA newborn   . Hx of preeclampsia, prior pregnancy, currently pregnant   . Hypertension     Past Surgical History:  Past Surgical History:  Procedure Laterality Date  . DILATION AND CURETTAGE OF UTERUS     x2 for SAB and missed AB    Gynecologic History: No LMP recorded.  Obstetric History: [id number]  Family History:  Family History  Problem Relation Age of Onset  . Heart failure Mother   . Hypertension Mother   . Breast cancer Other     Social History:  Social History   Social History  . Marital status: Single    Spouse name: N/A  . Number of children: N/A  . Years of education: N/A   Occupational History  . Not on file.   Social History Main Topics  . Smoking status: Current Every Day Smoker    Years: 13.00    Types: Cigarettes  . Smokeless tobacco: Never Used     Comment: 2 cig daily  . Alcohol use No  . Drug use: No  . Sexual activity: Yes    Birth control/ protection: Surgical   Other Topics Concern  . Not on file   Social History Narrative  . No narrative on file    Allergies:  No Known Allergies  Medications: Prior to Admission medications    Medication Sig Start Date End Date Taking? Authorizing Provider  labetalol (NORMODYNE) 200 MG tablet Take 1 tablet (200 mg total) by mouth 2 (two) times daily. 08/13/16  Yes Nadara Mustard, MD  Prenat w/o A Vit-FeFum-FePo-FA (PROVIDA OB) 20-20-1.25 MG CAPS Take 1 capsule by mouth daily. 06/05/16  Yes [provider]    Physical Exam Vitals:  Vitals:   12/04/16 1344  BP: 136/86  Pulse: 85   No LMP recorded.  General: NAD HEENT: normocephalic, anicteric Pulmonary: No increased work of breathing Cardiovascular: RRR, distal pulses 2+ Genitourinary:  External: Normal external female genitalia.  Normal urethral meatus, normal  Bartholin's and Skene's glands.    Vagina: Normal vaginal mucosa, no evidence of prolapse.    Cervix: Grossly normal in appearance, no bleeding, IUD strings visualized 2cm  Uterus: Non-enlarged, mobile, normal contour.  No CMT  Adnexa: ovaries non-enlarged, no adnexal masses  Rectal: deferred  Lymphatic: no evidence of inguinal lymphadenopathy Extremities: no edema, erythema, or tenderness Neurologic: Grossly intact Psychiatric: mood appropriate, affect full  Female chaperone present for pelvic and breast  portions of the physical exam  Assessment: 36 y.o. [id number] IUD string check  Plan: Problem List Items Addressed This Visit    None    Visit Diagnoses    IUD check up    -  Primary     - IUD strings visualized and in good location - Follow up 1 year for routine annual - A total of 15 minutes were spent in face-to-face contact with the patient during this encounter with over half of that time devoted to counseling and coordination of care.

## 2017-01-26 DIAGNOSIS — G5603 Carpal tunnel syndrome, bilateral upper limbs: Secondary | ICD-10-CM | POA: Diagnosis not present

## 2017-01-26 DIAGNOSIS — O24415 Gestational diabetes mellitus in pregnancy, controlled by oral hypoglycemic drugs: Secondary | ICD-10-CM | POA: Diagnosis not present

## 2017-01-26 DIAGNOSIS — O132 Gestational [pregnancy-induced] hypertension without significant proteinuria, second trimester: Secondary | ICD-10-CM | POA: Diagnosis not present

## 2017-02-07 DIAGNOSIS — G5601 Carpal tunnel syndrome, right upper limb: Secondary | ICD-10-CM | POA: Diagnosis not present

## 2017-02-07 DIAGNOSIS — G5603 Carpal tunnel syndrome, bilateral upper limbs: Secondary | ICD-10-CM | POA: Diagnosis not present

## 2017-02-08 ENCOUNTER — Encounter: Payer: Self-pay | Admitting: Obstetrics and Gynecology

## 2017-02-08 ENCOUNTER — Ambulatory Visit (INDEPENDENT_AMBULATORY_CARE_PROVIDER_SITE_OTHER): Payer: BLUE CROSS/BLUE SHIELD | Admitting: Obstetrics and Gynecology

## 2017-02-08 VITALS — BP 122/78 | HR 86 | Ht 62.0 in | Wt 163.0 lb

## 2017-02-08 DIAGNOSIS — Z3009 Encounter for other general counseling and advice on contraception: Secondary | ICD-10-CM | POA: Diagnosis not present

## 2017-02-08 DIAGNOSIS — Z30431 Encounter for routine checking of intrauterine contraceptive device: Secondary | ICD-10-CM | POA: Diagnosis not present

## 2017-02-08 DIAGNOSIS — N939 Abnormal uterine and vaginal bleeding, unspecified: Secondary | ICD-10-CM | POA: Diagnosis not present

## 2017-02-08 NOTE — Progress Notes (Signed)
Obstetrics & Gynecology Office Visit   Chief Complaint:  Chief Complaint  Patient presents with  . Follow-up    IUD check with bleeding    History of Present Illness: 36 y.o. patient presenting for follow up of Mirena IUD placement on 10/23/16.  She admits to complications since her IUD placement.  Still having some occasional spotting.  is able to feel strings.  Had heavier bleeding she describes as gushes, mild cramping, no bleeding since that time.  She is worried about this being related to the IUD and is considering BTL more strongly.    The patient did not follow up for her initial 6 week string check.    Review of Systems: Review of Systems  Constitutional: Negative for chills and fever.  Gastrointestinal: Negative for abdominal pain.  Genitourinary: Negative for dysuria, frequency and urgency.    Past Medical History:  Past Medical History:  Diagnosis Date  . Advanced maternal age in multigravida   . Anemia   . GERD (gastroesophageal reflux disease)   . Gestational diabetes    gestational  . History of prior pregnancy with SGA newborn   . Hx of preeclampsia, prior pregnancy, currently pregnant   . Hypertension     Past Surgical History:  Past Surgical History:  Procedure Laterality Date  . DILATION AND CURETTAGE OF UTERUS     x2 for SAB and missed AB  . INTRAUTERINE DEVICE (IUD) INSERTION  10/2016   Mirena    Gynecologic History: Patient's last menstrual period was 12/04/2016 (exact date).  Obstetric History: [id number]  Family History:  Family History  Problem Relation Age of Onset  . Heart failure Mother   . Hypertension Mother   . Breast cancer Other     Social History:  Social History   Social History  . Marital status: Single    Spouse name: N/A  . Number of children: N/A  . Years of education: N/A   Occupational History  . Not on file.   Social History Main Topics  . Smoking status: Former Smoker    Years: 13.00    Types:  Cigarettes  . Smokeless tobacco: Never Used     Comment: 2 cig daily  . Alcohol use No  . Drug use: No  . Sexual activity: Yes    Birth control/ protection: IUD   Other Topics Concern  . Not on file   Social History Narrative  . No narrative on file    Allergies:  No Known Allergies  Medications: Prior to Admission medications   Medication Sig Start Date End Date Taking? Authorizing Provider  labetalol (NORMODYNE) 200 MG tablet Take 1 tablet (200 mg total) by mouth 2 (two) times daily. 08/13/16  Yes Nadara Mustard, MD  levonorgestrel (MIRENA) 20 MCG/24HR IUD 1 each by Intrauterine route once.   Yes [employer], [place]  Prenat w/o A Vit-FeFum-FePo-FA (PROVIDA OB) 20-20-1.25 MG CAPS Take 1 capsule by mouth daily. 06/05/16  Yes [provider]    Physical Exam Vitals:  Vitals:   02/08/17 0816  BP: 122/78  Pulse: 86   Patient's last menstrual period was 12/04/2016 (exact date).  General: NAD HEENT: normocephalic, anicteric Pulmonary: CTAB, No increased work of breathing Cardiovascular: RRR, distal pulses 2+ Genitourinary:  External: Normal external female genitalia.  Normal urethral meatus, normal  Bartholin's and Skene's glands.    Vagina: Normal vaginal mucosa, no evidence of prolapse.    Cervix: Grossly normal in appearance, no bleeding, IUD  strings visualized 2cm  Uterus: Non-enlarged, mobile, normal contour.  No CMT  Adnexa: ovaries non-enlarged, no adnexal masses  Rectal: deferred  Lymphatic: no evidence of inguinal lymphadenopathy Extremities: no edema, erythema, or tenderness Neurologic: Grossly intact Psychiatric: mood appropriate, affect full  Female chaperone present for pelvic portions of the physical exam  Assessment: 36 y.o. [id number] AUB with Mirena IUD desiring BTL  Plan: Problem List Items Addressed This Visit    None    Visit Diagnoses    IUD check up    -  Primary   Abnormal uterine bleeding (AUB)       Consultation for female  sterilization         1) Risks and benefits of IUD re-iterated the risks of irregular bleeding, cramping, infection, malpositioning, expulsion or uterine perforation of the IUD (1:1000 placements)  which may require further procedures such as laparoscopy.  IUDs while effective at preventing pregnancy do not prevent transmission of sexually transmitted diseases and use of barrier methods for this purpose was discussed.  Low overall incidence of failure with 99.7% efficacy rate in typical use.    2) We discussed proceeding with utlrasound to evalute placement and deployment of IUD arms but as she is requesting to proceed with BTL this is not necessary and we will proceed with removal at the time of IUD placement  3) BTL - Other reversible forms of contraception were discussed with patient; she declines all other modalities. Permanent nature of as well as associated risks of the procedure discussed with patient including but not limited to: risk of regret, permanence of method, bleeding, infection, injury to surrounding organs and need for additional procedures.  Failure risk of 0.5-1% with increased risk of ectopic gestation if pregnancy occurs was also discussed with patient.

## 2017-02-13 ENCOUNTER — Encounter
Admission: RE | Admit: 2017-02-13 | Discharge: 2017-02-13 | Disposition: A | Discharge status: Home / Self Care | Payer: BLUE CROSS/BLUE SHIELD | Source: Ambulatory Visit | Attending: Obstetrics and Gynecology | Admitting: Obstetrics and Gynecology

## 2017-02-13 HISTORY — DX: Family history of other specified conditions: Z84.89

## 2017-02-13 NOTE — Patient Instructions (Signed)
  Your procedure is scheduled on: 02-15-17  Report to Same Day Surgery 2nd floor medical mall [facility] Entrance-take elevator on left to 2nd floor.  Check in with surgery information desk.) To find out your arrival time please call [phone] between 1PM - 3PM on 02-14-17  Remember: Instructions that are not followed completely may result in serious medical risk, up to and including death, or upon the discretion of your surgeon and anesthesiologist your surgery may need to be rescheduled.    _x___ 1. Do not eat food after midnight the night before your procedure. NO GUM CHEWING OR HARD CANDIES.  You may drink clear liquids up to 2 hours before you are scheduled to arrive at the hospital for your procedure.  Do not drink clear liquids within 2 hours of your scheduled arrival to the hospital.  Clear liquids include  --Water or Apple juice without pulp  --Clear carbohydrate beverage such as ClearFast or Gatorade  --Black Coffee or Clear Tea (No milk, no creamers, do not add anything to the coffee or Tea)  Type 1 and type 2 diabetics should only drink water.     __x__ 2. No Alcohol for 24 hours before or after surgery.   __x__3. No Smoking for 24 prior to surgery.   ____  4. Bring all medications with you on the day of surgery if instructed.    __x__ 5. Notify your doctor if there is any change in your medical condition     (cold, fever, infections).     Do not wear jewelry, make-up, hairpins, clips or nail polish.  Do not wear lotions, powders, or perfumes. You may wear deodorant.  Do not shave 48 hours prior to surgery. Men may shave face and neck.  Do not bring valuables to the hospital.    [facility] is not responsible for any belongings or valuables.               Contacts, dentures or bridgework may not be worn into surgery.  Leave your suitcase in the car. After surgery it may be brought to your room.  For patients admitted to the hospital, discharge time is determined  by your treatment team.   Patients discharged the day of surgery will not be allowed to drive home.  You will need someone to drive you home and stay with you the night of your procedure.    Please read over the following fact sheets that you were given:      _x___ TAKE THE FOLLOWING MEDICATIONS THE MORNING OF SURGERY WITH A SMALL SIP OF WATER. These include:  1. LABETALOL  2.  3.  4.  5.  6.  ____Fleets enema or Magnesium Citrate as directed.   ____ Use CHG Soap or sage wipes as directed on instruction sheet   ____ Use inhalers on the day of surgery and bring to hospital day of surgery  ____ Stop Metformin and Janumet 2 days prior to surgery.    ____ Take 1/2 of usual insulin dose the night before surgery and none on the morning surgery.   ____ Follow recommendations from Cardiologist, Pulmonologist or PCP regarding stopping Aspirin, Coumadin, Plavix ,Eliquis, Effient, or Pradaxa, and Pletal.  X____Stop Anti-inflammatories such as Advil, Aleve, Ibuprofen, Motrin, Naproxen, Naprosyn, Goodies powders or aspirin products NOW-OK to take Tylenol    ____ Stop supplements until after surgery.    ____ Bring C-Pap to the hospital.

## 2017-02-14 ENCOUNTER — Encounter: Payer: Self-pay | Admitting: Obstetrics and Gynecology

## 2017-02-14 ENCOUNTER — Telehealth: Payer: Self-pay | Admitting: Obstetrics and Gynecology

## 2017-02-14 NOTE — Telephone Encounter (Signed)
Please call.

## 2017-02-14 NOTE — Telephone Encounter (Signed)
Pt would like Dr. Bonney AidStaebler to call her today. Pt has questions about tomorrow's surgery. [phone]

## 2017-02-15 ENCOUNTER — Encounter
Admission: RE | Disposition: A | Discharge status: Home / Self Care | Payer: Self-pay | Source: Ambulatory Visit | Attending: Obstetrics and Gynecology

## 2017-02-15 ENCOUNTER — Ambulatory Visit
Admission: RE | Admit: 2017-02-15 | Discharge: 2017-02-15 | Disposition: A | Discharge status: Home / Self Care | Payer: BLUE CROSS/BLUE SHIELD | Source: Ambulatory Visit | Attending: Obstetrics and Gynecology | Admitting: Obstetrics and Gynecology

## 2017-02-15 ENCOUNTER — Ambulatory Visit: Payer: BLUE CROSS/BLUE SHIELD | Admitting: Anesthesiology

## 2017-02-15 ENCOUNTER — Encounter: Payer: Self-pay | Admitting: *Deleted

## 2017-02-15 DIAGNOSIS — Z791 Long term (current) use of non-steroidal anti-inflammatories (NSAID): Secondary | ICD-10-CM | POA: Insufficient documentation

## 2017-02-15 DIAGNOSIS — F17210 Nicotine dependence, cigarettes, uncomplicated: Secondary | ICD-10-CM | POA: Diagnosis not present

## 2017-02-15 DIAGNOSIS — T8332XA Displacement of intrauterine contraceptive device, initial encounter: Secondary | ICD-10-CM | POA: Diagnosis not present

## 2017-02-15 DIAGNOSIS — Y762 Prosthetic and other implants, materials and accessory obstetric and gynecological devices associated with adverse incidents: Secondary | ICD-10-CM | POA: Insufficient documentation

## 2017-02-15 DIAGNOSIS — Z302 Encounter for sterilization: Secondary | ICD-10-CM | POA: Diagnosis not present

## 2017-02-15 DIAGNOSIS — Z79899 Other long term (current) drug therapy: Secondary | ICD-10-CM | POA: Insufficient documentation

## 2017-02-15 DIAGNOSIS — I10 Essential (primary) hypertension: Secondary | ICD-10-CM | POA: Diagnosis not present

## 2017-02-15 DIAGNOSIS — E119 Type 2 diabetes mellitus without complications: Secondary | ICD-10-CM | POA: Diagnosis not present

## 2017-02-15 DIAGNOSIS — Z9851 Tubal ligation status: Secondary | ICD-10-CM

## 2017-02-15 HISTORY — PX: LAPAROSCOPIC TUBAL LIGATION: SHX1937

## 2017-02-15 HISTORY — PX: IUD REMOVAL: SHX5392

## 2017-02-15 LAB — POCT PREGNANCY, URINE: Preg Test, Ur: NEGATIVE

## 2017-02-15 SURGERY — LIGATION, FALLOPIAN TUBE, LAPAROSCOPIC
Anesthesia: General | Laterality: Bilateral

## 2017-02-15 MED ORDER — PROPOFOL 10 MG/ML IV BOLUS
INTRAVENOUS | Status: DC | PRN
  Administered 2017-02-15: 120 mg via INTRAVENOUS

## 2017-02-15 MED ORDER — ONDANSETRON HCL 4 MG/2ML IJ SOLN
INTRAMUSCULAR | Status: DC | PRN
  Administered 2017-02-15: 4 mg via INTRAVENOUS

## 2017-02-15 MED ORDER — LACTATED RINGERS IV SOLN
INTRAVENOUS | Status: DC
  Administered 2017-02-15: 10:00:00 via INTRAVENOUS

## 2017-02-15 MED ORDER — SUGAMMADEX SODIUM 200 MG/2ML IV SOLN
INTRAVENOUS | Status: DC | PRN
  Administered 2017-02-15: 300 mg via INTRAVENOUS

## 2017-02-15 MED ORDER — ROCURONIUM BROMIDE 50 MG/5ML IV SOLN
INTRAVENOUS | Status: AC
  Filled 2017-02-15: qty 1

## 2017-02-15 MED ORDER — ROCURONIUM BROMIDE 100 MG/10ML IV SOLN
INTRAVENOUS | Status: DC | PRN
  Administered 2017-02-15: 35 mg via INTRAVENOUS

## 2017-02-15 MED ORDER — IBUPROFEN 600 MG PO TABS: 600.0000 mg | ORAL_TABLET | Freq: Once | ORAL | Status: DC

## 2017-02-15 MED ORDER — ONDANSETRON HCL 4 MG/2ML IJ SOLN
INTRAMUSCULAR | Status: AC
  Filled 2017-02-15: qty 2

## 2017-02-15 MED ORDER — OXYCODONE-ACETAMINOPHEN 5-325 MG PO TABS: 1.0000 | ORAL_TABLET | Freq: Four times a day (QID) | ORAL | 0 refills | Status: DC | PRN

## 2017-02-15 MED ORDER — FENTANYL CITRATE (PF) 100 MCG/2ML IJ SOLN
25.0000 ug | INTRAMUSCULAR | Status: DC | PRN
  Administered 2017-02-15 (×4): 25 ug via INTRAVENOUS

## 2017-02-15 MED ORDER — FENTANYL CITRATE (PF) 100 MCG/2ML IJ SOLN
INTRAMUSCULAR | Status: DC | PRN
  Administered 2017-02-15: 100 ug via INTRAVENOUS

## 2017-02-15 MED ORDER — SODIUM CHLORIDE 0.9 % IJ SOLN
INTRAMUSCULAR | Status: AC
  Filled 2017-02-15: qty 10

## 2017-02-15 MED ORDER — BUPIVACAINE HCL (PF) 0.5 % IJ SOLN
INTRAMUSCULAR | Status: AC
  Filled 2017-02-15: qty 30

## 2017-02-15 MED ORDER — ONDANSETRON HCL 4 MG/2ML IJ SOLN
4.0000 mg | Freq: Once | INTRAMUSCULAR | Status: DC | PRN
Start: 2017-02-15 — End: 2017-02-15

## 2017-02-15 MED ORDER — IBUPROFEN 600 MG PO TABS
ORAL_TABLET | ORAL | Status: AC
  Filled 2017-02-15: qty 1

## 2017-02-15 MED ORDER — OXYCODONE-ACETAMINOPHEN 5-325 MG PO TABS
1.0000 | ORAL_TABLET | Freq: Once | ORAL | Status: AC
  Administered 2017-02-15: 1 via ORAL

## 2017-02-15 MED ORDER — FENTANYL CITRATE (PF) 100 MCG/2ML IJ SOLN
INTRAMUSCULAR | Status: AC
  Filled 2017-02-15: qty 2

## 2017-02-15 MED ORDER — DEXAMETHASONE SODIUM PHOSPHATE 10 MG/ML IJ SOLN
INTRAMUSCULAR | Status: AC
  Filled 2017-02-15: qty 1

## 2017-02-15 MED ORDER — DEXAMETHASONE SODIUM PHOSPHATE 10 MG/ML IJ SOLN
INTRAMUSCULAR | Status: DC | PRN
  Administered 2017-02-15: 5 mg via INTRAVENOUS

## 2017-02-15 MED ORDER — EPHEDRINE SULFATE 50 MG/ML IJ SOLN
INTRAMUSCULAR | Status: AC
  Filled 2017-02-15: qty 1

## 2017-02-15 MED ORDER — LIDOCAINE HCL (PF) 2 % IJ SOLN
INTRAMUSCULAR | Status: AC
  Filled 2017-02-15: qty 10

## 2017-02-15 MED ORDER — FENTANYL CITRATE (PF) 100 MCG/2ML IJ SOLN
INTRAMUSCULAR | Status: AC
  Administered 2017-02-15: 25 ug via INTRAVENOUS
  Filled 2017-02-15: qty 2

## 2017-02-15 MED ORDER — MIDAZOLAM HCL 2 MG/2ML IJ SOLN
INTRAMUSCULAR | Status: AC
  Filled 2017-02-15: qty 2

## 2017-02-15 MED ORDER — FAMOTIDINE 20 MG PO TABS
ORAL_TABLET | ORAL | Status: AC
  Administered 2017-02-15: 20 mg via ORAL
  Filled 2017-02-15: qty 1

## 2017-02-15 MED ORDER — LIDOCAINE HCL (CARDIAC) 20 MG/ML IV SOLN
INTRAVENOUS | Status: DC | PRN
  Administered 2017-02-15: 60 mg via INTRAVENOUS

## 2017-02-15 MED ORDER — OXYCODONE-ACETAMINOPHEN 5-325 MG PO TABS
ORAL_TABLET | ORAL | Status: AC
  Administered 2017-02-15: 1 via ORAL
  Filled 2017-02-15: qty 1

## 2017-02-15 MED ORDER — BUPIVACAINE HCL 0.5 % IJ SOLN
INTRAMUSCULAR | Status: DC | PRN
  Administered 2017-02-15: 4 mL

## 2017-02-15 MED ORDER — FAMOTIDINE 20 MG PO TABS
20.0000 mg | ORAL_TABLET | Freq: Once | ORAL | Status: AC
Start: 2017-02-15 — End: 2017-02-15
  Administered 2017-02-15: 20 mg via ORAL

## 2017-02-15 MED ORDER — PHENYLEPHRINE HCL 10 MG/ML IJ SOLN
INTRAMUSCULAR | Status: DC | PRN
  Administered 2017-02-15 (×2): 100 ug via INTRAVENOUS
  Administered 2017-02-15: 200 ug via INTRAVENOUS

## 2017-02-15 MED ORDER — MIDAZOLAM HCL 2 MG/2ML IJ SOLN
INTRAMUSCULAR | Status: DC | PRN
  Administered 2017-02-15: 2 mg via INTRAVENOUS

## 2017-02-15 MED ORDER — IBUPROFEN 600 MG PO TABS: 600.0000 mg | ORAL_TABLET | Freq: Four times a day (QID) | ORAL | 3 refills | Status: DC | PRN

## 2017-02-15 SURGICAL SUPPLY — 22 items
BLADE SURG SZ11 CARB STEEL (BLADE) ×3 IMPLANT
CATH ROBINSON RED A/P 16FR (CATHETERS) ×3 IMPLANT
CHLORAPREP W/TINT 26ML (MISCELLANEOUS) ×3 IMPLANT
DERMABOND ADVANCED (GAUZE/BANDAGES/DRESSINGS) ×1
DERMABOND ADVANCED .7 DNX12 (GAUZE/BANDAGES/DRESSINGS) ×2 IMPLANT
DRSG TEGADERM 2-3/8X2-3/4 SM (GAUZE/BANDAGES/DRESSINGS) ×3 IMPLANT
GLOVE BIO SURGEON STRL SZ7 (GLOVE) ×12 IMPLANT
GLOVE INDICATOR 7.5 STRL GRN (GLOVE) ×12 IMPLANT
GOWN STRL REUS W/ TWL LRG LVL3 (GOWN DISPOSABLE) ×4 IMPLANT
GOWN STRL REUS W/TWL LRG LVL3 (GOWN DISPOSABLE) ×2
KIT DISPOSABLE FALLOPE RING (Ring) ×3 IMPLANT
KIT PINK PAD W/HEAD ARE REST (MISCELLANEOUS) ×3
KIT PINK PAD W/HEAD ARM REST (MISCELLANEOUS) ×2 IMPLANT
KIT RM TURNOVER CYSTO AR (KITS) ×3 IMPLANT
LABEL OR SOLS (LABEL) ×3 IMPLANT
NS IRRIG 500ML POUR BTL (IV SOLUTION) ×3 IMPLANT
PACK GYN LAPAROSCOPIC (MISCELLANEOUS) ×3 IMPLANT
PAD OB MATERNITY 4.3X12.25 (PERSONAL CARE ITEMS) ×3 IMPLANT
PAD PREP 24X41 OB/GYN DISP (PERSONAL CARE ITEMS) ×3 IMPLANT
SUT MNCRL AB 4-0 PS2 18 (SUTURE) ×3 IMPLANT
TROCAR XCEL NON-BLD 5MMX100MML (ENDOMECHANICALS) ×3 IMPLANT
TUBING INSUFFLATOR HI FLOW (MISCELLANEOUS) ×3 IMPLANT

## 2017-02-15 NOTE — Transfer of Care (Signed)
Immediate Anesthesia Transfer of Care Note  Patient: Natasha Larson  Procedure(s) Performed: LAPAROSCOPIC TUBAL LIGATION (Bilateral ) INTRAUTERINE DEVICE (IUD) REMOVAL  Patient Location: PACU  Anesthesia Type:General  Level of Consciousness: awake, alert  and oriented  Airway & Oxygen Therapy: Patient spontaneous breathing  Post-op Assessment: Report given to RN and Post -op Vital signs reviewed and stable  Post vital signs: Reviewed and stable  Last Vitals:  Vitals:   02/15/17 0958 02/15/17 1137  BP: 106/75 (!) 141/85  Pulse: 73 96  Resp: 18 17  Temp: (!) 36.1 C (!) 36.3 C  SpO2: 100% 100%    Last Pain:  Vitals:   02/15/17 1137  TempSrc: Temporal         Complications: No apparent anesthesia complications

## 2017-02-15 NOTE — H&P (Signed)
Date of Initial H&P: 02/08/17  History reviewed, patient examined, no change in status, stable for surgery.  

## 2017-02-15 NOTE — Op Note (Signed)
Preoperative Diagnosis: 1) 36 y.o.  [id number] with undesired fertility 2) Mirena IUD migration into lower uterine segement  Postoperative Diagnosis: 1) 36 y.o.  [id number] with undesired fertility 2) Mirena IUD migration into lower uterine segement  Operation Performed: Laparoscopic bilateral tubal ligation via falope ring application, Mirena IUD removal  Indication: 36 y.o. [id number]  with undesired fertility, desires permanent sterilization.  Other reversible forms of contraception were discussed with patient; she declines all other modalities. Permanent nature of as well as associated risks of the procedure discussed with patient including but not limited to: risk of regret, permanence of method, bleeding, infection, injury to surrounding organs and need for additional procedures.  Failure risk of 0.5-1% with increased risk of ectopic gestation if pregnancy occurs was also discussed with patient.    Surgeon: Vena AustriaAndreas Treonna Klee, MD  Anesthesia: General  Preoperative Antibiotics: none  Estimated Blood Loss: minimal IV Fluids: 450mL  Urine Output:: 20mL  Drains or Tubes: none  Implants: none  Specimens Removed: none  Complications: none  Intraoperative Findings: Normal tubes, ovaries, and uterus.  Good 1cm knuckle of tube applied within each falope ring.  Patient Condition: stable  Procedure in Detail:  Patient was taken to the operating room where she was administered general anesthesia.  She was positioned in the dorsal lithotomy position utilizing Allen stirups, prepped and draped in the usual sterile fashion.  Prior to proceeding with procedure a time out was performed.  Attention was turned to the patient's pelvis.  A red rubber catheter was used to empty the patient's bladder.  An operative speculum was placed to allow visualization of the cervix.  IUD strings were visualized and the Mirena was removed without difficulty using ring forceps.  The anterior lip of the cervix was grasped  with a single tooth tenaculum, and a Hulka tenaculum was placed to allow manipulation of the uterus.  The operative speculum and single tooth tenaculum were then removed.  Attention was turned to the patient's abdomen.  The umbilicus was infiltrated with 1% Sensorcaine, before making a stab incision using an 11 blade scalpel.  A 5mm Excel trocar was then used to gain direct entry into the peritoneal cavity utilizing the camera to visualize progress of the trocar during placement.  Once peritoneal entry had been achieved, insufflation was started and pneumoperitoneum established at a pressure of 15mmHg.   General inspection of the abdomen revealed the above noted findings.  A spot 2cm midline above the pubic symphysis was injected with 1% Sensorcaine and a stab incision was made using an 11 blade scalpel.  The 8mm falope ring trocar was place suprapubic through this incision under direct visualization.  The right tube was identified and grasped in a mid-isthmic portion using the falope ring application.  The falope ring was applied with a good 1cm knuckle of blanching tube noted within the falope ring after application.  The left tube was identified and a second falope ring was applied in a similar fashion.  Pneumoperitoneum was evacuated.  The trocars were removed.  The 8mm trocar site was closed with 4-0 Monocryl in a subcuticular fashion.  All trocar sites were then dressed with surgical skin glue.  The Hulka tenaculum was removed.  Sponge needle and instrument counts were correct time two.  The patient tolerated the procedure well and was taken to the recovery room in stable condition.

## 2017-02-15 NOTE — Anesthesia Postprocedure Evaluation (Signed)
Anesthesia Post Note  Patient: Natasha Larson  Procedure(s) Performed: LAPAROSCOPIC TUBAL LIGATION (Bilateral ) INTRAUTERINE DEVICE (IUD) REMOVAL  Patient location during evaluation: PACU Anesthesia Type: General Level of consciousness: awake and alert Pain management: pain level controlled Vital Signs Assessment: post-procedure vital signs reviewed and stable Respiratory status: spontaneous breathing, nonlabored ventilation, respiratory function stable and patient connected to nasal cannula oxygen Cardiovascular status: blood pressure returned to baseline and stable Postop Assessment: no apparent nausea or vomiting Anesthetic complications: no     Last Vitals:  Vitals:   02/15/17 1223 02/15/17 1300  BP: 130/82 123/71  Pulse: 60 88  Resp: 12 14  Temp:  (!) 36.1 C  SpO2: 94% 100%    Last Pain:  Vitals:   02/15/17 1300  TempSrc: Tympanic  PainSc: 7                  Heba Ige S

## 2017-02-15 NOTE — Anesthesia Procedure Notes (Signed)
Procedure Name: Intubation Date/Time: 02/15/2017 10:48 AM Performed by: Hedda Slade Pre-anesthesia Checklist: Patient identified, Patient being monitored, Timeout performed, Emergency Drugs available and Suction available Patient Re-evaluated:Patient Re-evaluated prior to induction Oxygen Delivery Method: Circle system utilized Preoxygenation: Pre-oxygenation with 100% oxygen Induction Type: IV induction Ventilation: Mask ventilation without difficulty Laryngoscope Size: Mac and 3 Grade View: Grade I Tube type: Oral Tube size: 7.0 mm Number of attempts: 1 Airway Equipment and Method: Stylet Placement Confirmation: ETT inserted through vocal cords under direct vision,  positive ETCO2 and breath sounds checked- equal and bilateral Secured at: 21 cm Tube secured with: Tape Dental Injury: Teeth and Oropharynx as per pre-operative assessment

## 2017-02-15 NOTE — Anesthesia Preprocedure Evaluation (Signed)
Anesthesia Evaluation  Patient identified by MRN, date of birth, ID band Patient awake    Reviewed: Allergy & Precautions, NPO status , Patient's Chart, lab work & pertinent test results, reviewed documented beta blocker date and time   History of Anesthesia Complications (+) Family history of anesthesia reaction  Airway Mallampati: II  TM Distance: >3 FB     Dental  (+) Chipped   Pulmonary Current Smoker,           Cardiovascular hypertension, Pt. on medications and Pt. on home beta blockers      Neuro/Psych    GI/Hepatic   Endo/Other  diabetes, Type 2  Renal/GU      Musculoskeletal   Abdominal   Peds  Hematology  (+) anemia ,   Anesthesia Other Findings   Reproductive/Obstetrics                             Anesthesia Physical Anesthesia Plan  ASA: II  Anesthesia Plan: General   Post-op Pain Management:    Induction: Intravenous  PONV Risk Score and Plan:   Airway Management Planned: Oral ETT  Additional Equipment:   Intra-op Plan:   Post-operative Plan:   Informed Consent: I have reviewed the patients History and Physical, chart, labs and discussed the procedure including the risks, benefits and alternatives for the proposed anesthesia with the patient or authorized representative who has indicated his/her understanding and acceptance.     Plan Discussed with: CRNA  Anesthesia Plan Comments:         Anesthesia Quick Evaluation

## 2017-02-15 NOTE — Anesthesia Post-op Follow-up Note (Signed)
Anesthesia QCDR form completed.        

## 2017-02-20 ENCOUNTER — Ambulatory Visit (INDEPENDENT_AMBULATORY_CARE_PROVIDER_SITE_OTHER): Payer: BLUE CROSS/BLUE SHIELD | Admitting: Obstetrics and Gynecology

## 2017-02-20 ENCOUNTER — Encounter: Payer: Self-pay | Admitting: Obstetrics and Gynecology

## 2017-02-20 VITALS — BP 116/72 | HR 77 | Wt 166.0 lb

## 2017-02-20 DIAGNOSIS — Z4889 Encounter for other specified surgical aftercare: Secondary | ICD-10-CM

## 2017-02-20 NOTE — Progress Notes (Signed)
      Postoperative Follow-up Patient presents post op from laparoscopic BTL 1weeks ago for requested sterilization.  Subjective: Patient reports some improvement in her preop symptoms. Eating a regular diet without difficulty. Pain is controlled with current analgesics. Medications being used: ibuprofen (OTC) and narcotic analgesics including percocet.  Activity: continues to have some restrictions in day to day activities because of soreness at incision sites.  Objective: Vitals:   02/20/17 1136  BP: 116/72  Pulse: 77   Gen: NAD Abdomen: soft, non-distended, appropriately tender around incision sites, trocar sites D/C/I Ext: no edema  Assessment: 36 y.o. s/p lap BTL stable  Plan: Patient has done well after surgery with no apparent complications.  I have discussed the post-operative course to date, and the expected progress moving forward.  The patient understands what complications to be concerned about.  I will see the patient in routine follow up, or sooner if needed.    Activity plan: May return to work in 14 days postop or NOV 5th  Vena Austriandreas Jerra Huckeby 02/20/2017, 5:41 PM

## 2017-02-22 ENCOUNTER — Telehealth: Payer: Self-pay

## 2017-02-22 NOTE — Telephone Encounter (Signed)
Ryan from [employer] is calling regarding pt FMLA paperwork. States pt had BTL 10/18 and is wondering if it was elective or medically necessary. If it was elective, she will have a hard time getting it supported. Please call at [phone] ext [phone]

## 2017-02-25 ENCOUNTER — Encounter: Payer: Self-pay | Admitting: Obstetrics and Gynecology

## 2017-02-27 ENCOUNTER — Telehealth: Payer: Self-pay

## 2017-02-27 NOTE — Telephone Encounter (Signed)
FMLA/DISABILITY form for [employer] filled out and given to [place] for processing. 

## 2017-02-27 NOTE — Telephone Encounter (Signed)
Left detailed msg that pt tried IUD, had issues with it, a mutual decision was made to have tubes tied.

## 2017-02-27 NOTE — Telephone Encounter (Signed)
[facility] [employer] calling to discuss patient's short term disability due to surgery on 02/15/17 for BTL. This procedure is technically considered elective and in order for it to be approved for the disability they need to confirm that the pt was advised to have the surgery. Claim # [id number]. Phone # for [employer] [phone] ext [phone]. Thank you.

## 2017-03-06 ENCOUNTER — Encounter: Payer: Self-pay | Admitting: Obstetrics and Gynecology

## 2017-03-27 ENCOUNTER — Encounter: Payer: Self-pay | Admitting: Obstetrics and Gynecology

## 2017-03-27 ENCOUNTER — Ambulatory Visit (INDEPENDENT_AMBULATORY_CARE_PROVIDER_SITE_OTHER): Payer: BLUE CROSS/BLUE SHIELD | Admitting: Obstetrics and Gynecology

## 2017-03-27 VITALS — BP 120/80 | HR 80 | Ht 62.0 in | Wt 169.0 lb

## 2017-03-27 DIAGNOSIS — Z4889 Encounter for other specified surgical aftercare: Secondary | ICD-10-CM

## 2017-03-28 NOTE — Progress Notes (Signed)
      Postoperative Follow-up Patient presents post op from laparoscopic tubal ligation and removal of malposition Mirena IUD 6weeks ago for abnormal uterine bleeding and requested sterilization.  Subjective: Patient reports marked improvement in her preop symptoms. Eating a regular diet without difficulty. The patient is not having any pain.  Activity: normal activities of daily living.  Objective: Vitals:   03/27/17 1638  BP: 120/80  Pulse: 80   Gen: NAD Abdomen: soft, non-tender, non-distended, trocar sites D/C/I  Assessment: 36 y.o. s/p BTL and removal of Mirena stable  Plan: Patient has done well after surgery with no apparent complications.  I have discussed the post-operative course to date, and the expected progress moving forward.  The patient understands what complications to be concerned about.  I will see the patient in routine follow up, or sooner if needed.    Activity plan: No restriction.  Return in about 1 year (around 03/27/2018) for annual.  Vena AustriaAndreas Ziana Heyliger 03/28/2017, 2:03 PM

## 2017-04-02 ENCOUNTER — Encounter: Payer: Self-pay | Admitting: Obstetrics and Gynecology

## 2017-04-02 NOTE — Telephone Encounter (Signed)
There is a letter in her chart from today if you could fax her postoperative notes

## 2017-04-03 ENCOUNTER — Encounter: Payer: Self-pay | Admitting: Obstetrics and Gynecology

## 2017-04-12 ENCOUNTER — Encounter: Payer: Self-pay | Admitting: Obstetrics and Gynecology

## 2017-09-10 ENCOUNTER — Telehealth: Payer: Self-pay

## 2017-09-10 NOTE — Telephone Encounter (Signed)
Pts doctor calling about Workers comp claim. Doctor would like to know if pt has any activity restrictions or limitations. Claim is due by May 14th, 2019. Dr CB# [phone]

## 2017-09-11 NOTE — Telephone Encounter (Signed)
I left a message and my direct extension on the number given.

## 2017-09-11 NOTE — Telephone Encounter (Signed)
Called the number didn't get anyone, she has no restrictions as far as we are concerned we also never saw her for any workers comp related matters.  She had an uncomplicated tubal ligation with [place] in October 2018

## 2017-09-11 NOTE — Telephone Encounter (Signed)
I spoke to Ebony Hail, he is faxing a copy of questions regarding when the patient was out for surgery in 01/2017.   Tonya please make sure whomever does FMLA gets these forms. Thank you

## 2017-09-11 NOTE — Telephone Encounter (Signed)
Dr office calling again about this matter.

## 2017-09-11 NOTE — Telephone Encounter (Signed)
Please advise 

## 2017-09-18 ENCOUNTER — Encounter: Payer: Self-pay | Admitting: Obstetrics and Gynecology

## 2017-09-18 ENCOUNTER — Telehealth: Payer: Self-pay

## 2017-09-18 NOTE — Telephone Encounter (Signed)
Patient is calling back to speak with Natasha Larson. Please call patient. Thank you

## 2017-09-18 NOTE — Telephone Encounter (Signed)
Natasha Larson, please see message from last week. Forms were supposed to be faxed over and given to either you or Embden Sink. Please follow up with Tonya to see if they have been received.

## 2017-09-18 NOTE — Telephone Encounter (Signed)
I called patient and told her about the forms Ebony Hail was supposed to be faxing over regarding surgery in 01/2017. Pt given his number to follow up with them

## 2017-09-18 NOTE — Telephone Encounter (Signed)
Spoke with Natasha Larson and she has not received any FMLA forms for her.

## 2017-09-18 NOTE — Telephone Encounter (Signed)
Pt states she needs documentation that the tubal ligation was medically necessary, needs to be submitted by the end of the month. Pt states that her work has sent something over for this. I advised pt I would send to AMS nurse. I do see an email from pt today regarding this. Just wanted to add to it that she did call triage line. Pt aware he will be in office Thursday.

## 2017-09-19 NOTE — Telephone Encounter (Signed)
Pt states she called Darren from last week and he stated the forms for Dr. Bonney Aid has to come from the insurance company. Pt aware once we get forms from the insurance company I will notify her of the status. KJ CMA

## 2018-03-27 ENCOUNTER — Encounter: Payer: Self-pay | Admitting: Obstetrics and Gynecology

## 2018-03-27 ENCOUNTER — Ambulatory Visit (INDEPENDENT_AMBULATORY_CARE_PROVIDER_SITE_OTHER): Payer: BLUE CROSS/BLUE SHIELD | Admitting: Obstetrics and Gynecology

## 2018-03-27 ENCOUNTER — Ambulatory Visit: Payer: BLUE CROSS/BLUE SHIELD | Admitting: Obstetrics and Gynecology

## 2018-03-27 VITALS — BP 132/77 | HR 89 | Ht 61.0 in | Wt 175.0 lb

## 2018-03-27 DIAGNOSIS — Z01419 Encounter for gynecological examination (general) (routine) without abnormal findings: Secondary | ICD-10-CM | POA: Diagnosis not present

## 2018-03-27 DIAGNOSIS — K5900 Constipation, unspecified: Secondary | ICD-10-CM

## 2018-03-27 DIAGNOSIS — Z1329 Encounter for screening for other suspected endocrine disorder: Secondary | ICD-10-CM

## 2018-03-27 DIAGNOSIS — R5383 Other fatigue: Secondary | ICD-10-CM

## 2018-03-27 DIAGNOSIS — Z1239 Encounter for other screening for malignant neoplasm of breast: Secondary | ICD-10-CM

## 2018-03-27 NOTE — Patient Instructions (Signed)
[facility] [address] [place] [address]  [facility]  [address] [place] [address]  Phone: [phone]  

## 2018-03-27 NOTE — Progress Notes (Signed)
Gynecology Annual Exam   PCP: Patient, No Pcp Per  Chief Complaint:  Chief Complaint  Patient presents with  . Gynecologic Exam    discuss weigth gain  . Menometrorrhagia    History of Present Illness: Patient is a 37 y.o. [id number] presents for annual exam. The patient has no complaints today.   LMP: Patient's last menstrual period was 03/15/2018 (exact date). Average Interval: regular, 28 days Duration of flow: 5 days Heavy Menses: yes Clots: yes Intermenstrual Bleeding: no Postcoital Bleeding: no Dysmenorrhea: yes  She also reports 10lbs weight gain over the past year, constipation, and fatigue.  The patient is sexually active. She currently uses tubal ligation for contraception. She denies dyspareunia.  The patient does perform self breast exams.  There is no notable family history of breast or ovarian cancer in her family.  The patient wears seatbelts: yes.   The patient has regular exercise: not asked.    The patient denies current symptoms of depression.    Review of Systems: Review of Systems  Constitutional: Positive for malaise/fatigue. Negative for chills, fever and weight loss.  HENT: Negative for congestion.   Respiratory: Negative for cough and shortness of breath.   Cardiovascular: Negative for chest pain and palpitations.  Gastrointestinal: Positive for constipation. Negative for abdominal pain, diarrhea, heartburn, nausea and vomiting.  Genitourinary: Negative for dysuria, frequency and urgency.  Skin: Negative for itching and rash.  Neurological: Negative for dizziness and headaches.  Endo/Heme/Allergies: Negative for polydipsia.  Psychiatric/Behavioral: Negative for depression.    Past Medical History:  Past Medical History:  Diagnosis Date  . Advanced maternal age in multigravida   . Anemia    LAST HGB 01-26-17 WAS WNL  . Family history of adverse reaction to anesthesia    PT STATES HER DAUGHTERS FACE BECAME SWOLLEN AFTER SURGERY  (TONSILLECTOMY) AGE 33 AND STATES IT WAS CAUSED BY THE ANESTHESIA THAT WAS USED  . Gestational diabetes    gestational  . History of prior pregnancy with SGA newborn   . Hx of preeclampsia, prior pregnancy, currently pregnant   . Hypertension     Past Surgical History:  Past Surgical History:  Procedure Laterality Date  . DILATION AND CURETTAGE OF UTERUS     x2 for SAB and missed AB  . INTRAUTERINE DEVICE (IUD) INSERTION  10/2016   Mirena  . IUD REMOVAL  02/15/2017   Procedure: INTRAUTERINE DEVICE (IUD) REMOVAL;  Surgeon: Vena Austria, MD;  Location: [facility] ORS;  Service: Gynecology;;  . LAPAROSCOPIC TUBAL LIGATION Bilateral 02/15/2017   Procedure: LAPAROSCOPIC TUBAL LIGATION;  Surgeon: Vena Austria, MD;  Location: [facility] OR[facility];  Service: Gynecology;  Laterality: Bilateral;    Gynecologic History:  Patient's last menstrual period was 03/15/2018 (exact date). Contraception: tubal ligation Last Pap: Results were:09/20/2016 NIL and HR HPV negative   Obstetric History: [id number]  Family History:  Family History  Problem Relation Age of Onset  . Heart failure Mother   . Hypertension Mother   . Breast cancer Other     Social History:  Social History   Socioeconomic History  . Marital status: Single    Spouse name: Not on file  . Number of children: Not on file  . Years of education: Not on file  . Highest education level: Not on file  Occupational History  . Not on file  Social Needs  . Financial resource strain: Not on file  . Food insecurity:    Worry: Not on file    Inability:  Not on file  . Transportation needs:    Medical: Not on file    Non-medical: Not on file  Tobacco Use  . Smoking status: Current Every Day Smoker    Years: 13.00    Types: Cigarettes  . Smokeless tobacco: Never Used  . Tobacco comment: 2 cig daily  Substance and Sexual Activity  . Alcohol use: No  . Drug use: No  . Sexual activity: Yes    Birth control/protection: Surgical    Lifestyle  . Physical activity:    Days per week: Not on file    Minutes per session: Not on file  . Stress: Not on file  Relationships  . Social connections:    Talks on phone: Not on file    Gets together: Not on file    Attends religious service: Not on file    Active member of club or organization: Not on file    Attends meetings of clubs or organizations: Not on file    Relationship status: Not on file  . Intimate partner violence:    Fear of current or ex partner: Not on file    Emotionally abused: Not on file    Physically abused: Not on file    Forced sexual activity: Not on file  Other Topics Concern  . Not on file  Social History Narrative  . Not on file    Allergies:  No Known Allergies  Medications: Prior to Admission medications   Medication Sig Start Date End Date Taking? Authorizing Provider  ibuprofen (ADVIL,MOTRIN) 600 MG tablet Take 1 tablet (600 mg total) by mouth every 6 (six) hours as needed. Patient not taking: Reported on 03/27/2018 02/15/17   Vena Austria, MD  labetalol (NORMODYNE) 200 MG tablet Take 1 tablet (200 mg total) by mouth 2 (two) times daily. Patient not taking: Reported on 03/27/2018 08/13/16   Nadara Mustard, MD  oxyCODONE-acetaminophen (PERCOCET/ROXICET) 5-325 MG tablet Take 1-2 tablets by mouth every 6 (six) hours as needed. Patient not taking: Reported on 03/27/2018 02/15/17   Vena Austria, MD  Prenat w/o A Vit-FeFum-FePo-FA (PROVIDA OB) 20-20-1.25 MG CAPS Take 1 capsule by mouth daily. 06/05/16   [provider]    Physical Exam Vitals: Blood pressure 132/77, pulse 89, height 5\' 1"  (1.549 m), weight 175 lb (79.4 kg), last menstrual period 03/15/2018, not currently breastfeeding.  General: NAD HEENT: normocephalic, anicteric Thyroid: no enlargement, no palpable nodules Pulmonary: No increased work of breathing, CTAB Cardiovascular: RRR, distal pulses 2+ Breast: Breast symmetrical, no tenderness, no palpable  nodules or masses, no skin or nipple retraction present, no nipple discharge.  No axillary or supraclavicular lymphadenopathy. Abdomen: NABS, soft, non-tender, non-distended.  Umbilicus without lesions.  No hepatomegaly, splenomegaly or masses palpable. No evidence of hernia  Genitourinary:  External: Normal external female genitalia.  Normal urethral meatus, normal Bartholin's and Skene's glands.    Vagina: Normal vaginal mucosa, no evidence of prolapse.    Cervix: Grossly normal in appearance, no bleeding  Uterus: Non-enlarged, mobile, normal contour.  No CMT  Adnexa: ovaries non-enlarged, no adnexal masses  Rectal: deferred  Lymphatic: no evidence of inguinal lymphadenopathy Extremities: no edema, erythema, or tenderness Neurologic: Grossly intact Psychiatric: mood appropriate, affect full  Female chaperone present for pelvic and breast  portions of the physical exam    Assessment: 38 y.o. [id number] routine annual exam  Plan: Problem List Items Addressed This Visit    None    Visit Diagnoses    Thyroid disorder screening    -  Primary   Relevant Orders   Thyroid Panel With TSH   Encounter for gynecological examination without abnormal finding       Relevant Orders   Thyroid Panel With TSH   Breast screening       Relevant Orders   MM 3D SCREEN BREAST BILATERAL   Fatigue, unspecified type       Relevant Orders   Thyroid Panel With TSH   Constipation, unspecified constipation type       Relevant Orders   Thyroid Panel With TSH      2) STI screening  was notoffered and therefore not obtained  2)  ASCCP guidelines and rational discussed.  Patient opts for every 3 years screening interval  3) Contraception - the patient is currently using  tubal ligation.  She is happy with her current form of contraception and plans to continue  4) Routine healthcare maintenance including cholesterol, diabetes screening discussed managed by PCP - Will check thyroid given constellation  of constipation, weight gain, and fatigue  5) Return in about 1 year (around 03/28/2019) for annual.   Vena AustriaAndreas Myrtha Tonkovich, MD, Merlinda FrederickFACOG [facility], [facility] 03/27/2018, 2:57 PM

## 2018-12-31 ENCOUNTER — Other Ambulatory Visit: Payer: Self-pay

## 2018-12-31 DIAGNOSIS — R6889 Other general symptoms and signs: Secondary | ICD-10-CM | POA: Diagnosis not present

## 2018-12-31 DIAGNOSIS — Z20822 Contact with and (suspected) exposure to covid-19: Secondary | ICD-10-CM

## 2019-01-02 ENCOUNTER — Telehealth: Payer: Self-pay | Admitting: General Practice

## 2019-01-02 LAB — NOVEL CORONAVIRUS, NAA: SARS-CoV-2, NAA: NOT DETECTED

## 2019-01-02 NOTE — Telephone Encounter (Signed)
Negative COVID results given. Patient results "NOT Detected." Caller expressed understanding. ° °

## 2019-01-30 ENCOUNTER — Other Ambulatory Visit: Payer: Self-pay

## 2019-01-30 DIAGNOSIS — Z20822 Contact with and (suspected) exposure to covid-19: Secondary | ICD-10-CM

## 2019-01-30 DIAGNOSIS — Z20828 Contact with and (suspected) exposure to other viral communicable diseases: Secondary | ICD-10-CM | POA: Diagnosis not present

## 2019-01-31 LAB — NOVEL CORONAVIRUS, NAA: SARS-CoV-2, NAA: NOT DETECTED

## 2019-06-02 ENCOUNTER — Ambulatory Visit: Payer: BC Managed Care – PPO | Attending: Internal Medicine

## 2019-06-03 ENCOUNTER — Ambulatory Visit: Payer: BC Managed Care – PPO | Attending: Internal Medicine

## 2019-06-03 DIAGNOSIS — Z20822 Contact with and (suspected) exposure to covid-19: Secondary | ICD-10-CM | POA: Diagnosis not present

## 2019-06-04 LAB — NOVEL CORONAVIRUS, NAA: SARS-CoV-2, NAA: NOT DETECTED

## 2019-07-07 ENCOUNTER — Ambulatory Visit: Payer: BC Managed Care – PPO | Attending: Internal Medicine

## 2019-07-07 DIAGNOSIS — Z20822 Contact with and (suspected) exposure to covid-19: Secondary | ICD-10-CM | POA: Diagnosis not present

## 2019-07-08 LAB — NOVEL CORONAVIRUS, NAA: SARS-CoV-2, NAA: NOT DETECTED

## 2019-08-21 ENCOUNTER — Other Ambulatory Visit: Payer: BC Managed Care – PPO

## 2019-08-21 ENCOUNTER — Ambulatory Visit: Payer: BC Managed Care – PPO | Attending: Internal Medicine

## 2019-08-21 DIAGNOSIS — Z20822 Contact with and (suspected) exposure to covid-19: Secondary | ICD-10-CM

## 2019-08-22 LAB — SARS-COV-2, NAA 2 DAY TAT

## 2019-08-22 LAB — NOVEL CORONAVIRUS, NAA: SARS-CoV-2, NAA: NOT DETECTED

## 2019-11-13 ENCOUNTER — Other Ambulatory Visit: Payer: BC Managed Care – PPO

## 2019-12-08 ENCOUNTER — Ambulatory Visit: Payer: BC Managed Care – PPO | Attending: Internal Medicine

## 2019-12-08 DIAGNOSIS — Z23 Encounter for immunization: Secondary | ICD-10-CM

## 2019-12-08 NOTE — Progress Notes (Signed)
   Covid-19 Vaccination Clinic  Name:  Natasha Larson    MRN: [id number] DOB: 1980-05-14  12/08/2019  [name] was observed post Covid-19 immunization for 15 minutes without incident. She was provided with Vaccine Information Sheet and instruction to access the V-Safe system.   [name] was instructed to call 911 with any severe reactions post vaccine: Marland Kitchen Difficulty breathing  . Swelling of face and throat  . A fast heartbeat  . A bad rash all over body  . Dizziness and weakness   Immunizations Administered    Name Date Dose VIS Date Route   Pfizer COVID-19 Vaccine 12/08/2019  5:22 PM 0.3 mL 06/25/2018 Intramuscular   Manufacturer: [employer], [employer]   Lot: [id number]   NDC: [address]-5

## 2019-12-15 ENCOUNTER — Ambulatory Visit
Admission: EM | Admit: 2019-12-15 | Discharge: 2019-12-15 | Disposition: A | Discharge status: Home / Self Care | Payer: BC Managed Care – PPO

## 2019-12-15 ENCOUNTER — Other Ambulatory Visit: Payer: Self-pay

## 2019-12-15 DIAGNOSIS — R519 Headache, unspecified: Secondary | ICD-10-CM

## 2019-12-15 DIAGNOSIS — B0230 Zoster ocular disease, unspecified: Secondary | ICD-10-CM

## 2019-12-15 NOTE — Discharge Instructions (Addendum)
[facility]--Appt is at 2 pm Address: [address], [place], [place] [address] Phone: [phone]

## 2019-12-15 NOTE — ED Triage Notes (Addendum)
Pt states she got COVID vaccine last week. Unsure if related but headache started 2 days later unrelieved by Excedrin. Rash to bridge of nose and around left eye started yesterday and left eyelid swollen, reddened sclera. Painful itching eye VISUAL ACUITY: Right eye uncorrected 20/30 Left eye uncorrected 20/40 Both eyes uncorrected 20/25

## 2019-12-15 NOTE — ED Provider Notes (Signed)
[facility]    CSN: [id number] Arrival date & time: 12/15/19  0954      History   Chief Complaint Chief Complaint  Patient presents with  . Rash  . Eye Problem    HPI Natasha Larson is a 39 y.o. female.   Patient presents for 4 day history of left sided severe headaches. Admits to history of headaches, tried Excedrin w/o relief. She noticed a rash on the left side of her nose yesterday. Also noticed left eye redness and swelling of eyelids yesterday. Admits to itching. Denies drainage from the eye. Denies ear pain, n/v, dizziness, cough, sore throat. Patient says she does not have any allergies that she is aware of. Denies starting new medications. No other concerns.   Patient reports that she recently had the first dose of Moderna vaccine 1 week ago.      Past Medical History:  Diagnosis Date  . Advanced maternal age in multigravida   . Anemia    LAST HGB 01-26-17 WAS WNL  . Family history of adverse reaction to anesthesia    PT STATES HER DAUGHTERS FACE BECAME SWOLLEN AFTER SURGERY (TONSILLECTOMY) AGE 54 AND STATES IT WAS CAUSED BY THE ANESTHESIA THAT WAS USED  . Gestational diabetes    gestational  . History of prior pregnancy with SGA newborn   . Hx of preeclampsia, prior pregnancy, currently pregnant   . Hypertension     Patient Active Problem List   Diagnosis Date Noted  . Elevated blood pressure affecting pregnancy in third trimester, antepartum 08/10/2016  . Gestational hypertension 08/10/2016  . Tobacco use 07/18/2016  . High-risk pregnancy, elderly multigravida in third trimester 06/27/2016    Past Surgical History:  Procedure Laterality Date  . DILATION AND CURETTAGE OF UTERUS     x2 for SAB and missed AB  . INTRAUTERINE DEVICE (IUD) INSERTION  10/2016   Mirena  . IUD REMOVAL  02/15/2017   Procedure: INTRAUTERINE DEVICE (IUD) REMOVAL;  Surgeon: Vena Austria, MD;  Location: [facility] ORS;  Service: Gynecology;;  . LAPAROSCOPIC TUBAL  LIGATION Bilateral 02/15/2017   Procedure: LAPAROSCOPIC TUBAL LIGATION;  Surgeon: Vena Austria, MD;  Location: [facility] OR[facility];  Service: Gynecology;  Laterality: Bilateral;    OB History    Gravida  5   Para  3   Term  3   Preterm  0   AB  2   Living  3     SAB  2   TAB  0   Ectopic  0   Multiple  0   Live Births  3            Home Medications    Prior to Admission medications   Medication Sig Start Date End Date Taking? Authorizing Provider  ibuprofen (ADVIL,MOTRIN) 600 MG tablet Take 1 tablet (600 mg total) by mouth every 6 (six) hours as needed. Patient not taking: Reported on 03/27/2018 02/15/17   Vena Austria, MD  labetalol (NORMODYNE) 200 MG tablet Take 1 tablet (200 mg total) by mouth 2 (two) times daily. Patient not taking: Reported on 03/27/2018 08/13/16   Nadara Mustard, MD  oxyCODONE-acetaminophen (PERCOCET/ROXICET) 5-325 MG tablet Take 1-2 tablets by mouth every 6 (six) hours as needed. Patient not taking: Reported on 03/27/2018 02/15/17   Vena Austria, MD  Prenat w/o A Vit-FeFum-FePo-FA (PROVIDA OB) 20-20-1.25 MG CAPS Take 1 capsule by mouth daily. 06/05/16   [provider]    Family History Family History  Problem Relation Age  of Onset  . Heart failure Mother   . Hypertension Mother   . Breast cancer Other     Social History Social History   Tobacco Use  . Smoking status: Current Every Day Smoker    Packs/day: 0.25    Years: 13.00    Pack years: 3.25    Types: Cigarettes  . Smokeless tobacco: Never Used  . Tobacco comment: 2 cig daily  Vaping Use  . Vaping Use: Never used  Substance Use Topics  . Alcohol use: No  . Drug use: No     Allergies   Patient has no known allergies.   Review of Systems Review of Systems  Constitutional: Negative for chills, diaphoresis, fatigue and fever.  HENT: Negative for congestion, ear pain, rhinorrhea, sinus pressure, sinus pain and sore throat.   Eyes: Positive for pain,  redness and itching. Negative for photophobia, discharge and visual disturbance.  Respiratory: Negative for cough and shortness of breath.   Gastrointestinal: Negative for abdominal pain, nausea and vomiting.  Musculoskeletal: Negative for arthralgias and myalgias.  Skin: Negative for rash.  Neurological: Positive for headaches. Negative for dizziness, weakness, light-headedness and numbness.  Hematological: Negative for adenopathy.     Physical Exam Triage Vital Signs ED Triage Vitals  Enc Vitals Group     BP 12/15/19 1018 (!) 138/97     Pulse --      Resp 12/15/19 1018 17     Temp 12/15/19 1018 99.4 F (37.4 C)     Temp Source 12/15/19 1018 Oral     SpO2 12/15/19 1018 99 %     Weight 12/15/19 1017 160 lb (72.6 kg)     Height 12/15/19 1017 5\' 2"  (1.575 m)     Head Circumference --      Peak Flow --      Pain Score 12/15/19 1016 6     Pain Loc --      Pain Edu? --      Excl. in GC? --    No data found.  Updated Vital Signs BP (!) 138/97 (BP Location: Right Arm)   Temp 99.4 F (37.4 C) (Oral)   Resp 17   Ht 5\' 2"  (1.575 m)   Wt 160 lb (72.6 kg)   LMP 12/05/2019   SpO2 99%   BMI 29.26 kg/m   Visual Acuity Right Eye Distance:  20/30 Left Eye Distance:  20/40 Bilateral Distance:  20/25   Physical Exam Vitals and nursing note reviewed.  Constitutional:      Appearance: She is well-developed.  HENT:     Head: Normocephalic and atraumatic.     Right Ear: Tympanic membrane, ear canal and external ear normal.     Left Ear: Tympanic membrane, ear canal and external ear normal.     Nose: Nose normal.     Comments: Erythematous vesicular rash affecting left side of nose V2 distribution, does not cross midline. Rash non tender    Mouth/Throat:     Mouth: Mucous membranes are moist.  Eyes:     General: No scleral icterus.       Right eye: No discharge.        Left eye: No discharge.     Extraocular Movements: Extraocular movements intact.     Pupils: Pupils are  equal, round, and reactive to light.     Comments: Moderate swelling bilateral lids of left eye, conjunctival injection present, no drainage . Forehead on left side is tender  Cardiovascular:  Rate and Rhythm: Normal rate and regular rhythm.  Pulmonary:     Effort: Pulmonary effort is normal. No respiratory distress.     Breath sounds: Normal breath sounds.  Musculoskeletal:     Cervical back: Neck supple.  Lymphadenopathy:     Cervical: No cervical adenopathy.  Skin:    General: Skin is warm and dry.  Neurological:     General: No focal deficit present.     Mental Status: She is alert and oriented to person, place, and time. Mental status is at baseline.     Cranial Nerves: No cranial nerve deficit.     Motor: No weakness.     Gait: Gait normal.  Psychiatric:        Mood and Affect: Mood normal.        Behavior: Behavior normal.        Thought Content: Thought content normal.      UC Treatments / Results  Labs (all labs ordered are listed, but only abnormal results are displayed) Labs Reviewed - No data to display  EKG   Radiology No results found.  Procedures Procedures (including critical care time)  Medications Ordered in UC Medications - No data to display  Initial Impression / Assessment and Plan / UC Course  I have reviewed the triage vital signs and the nursing notes.  Pertinent labs & imaging results that were available during my care of the patient were reviewed by me and considered in my medical decision making (see chart for details).   Patient's exam is consistent with herpes zoster ophthalmicus. Spoke with on call ophthalmology and was able to make immediate evaluation for her through [facility] in [place] at 2 pm today. Patient states able to make the appointment. No meds given in clinic or for at home since she is getting evaluation for this through eye specialist and they may want different treatment.    Final Clinical Impressions(s) / UC  Diagnoses   Final diagnoses:  Herpes zoster ophthalmicus of left eye  Acute nonintractable headache, unspecified headache type     Discharge Instructions     [facility]--Appt is at 2 pm Address: [address], [place], [place] [address] Phone: [phone]    ED Prescriptions    None     PDMP not reviewed this encounter.   Shirlee Latch, PA-C 12/15/19 1200

## 2020-01-12 ENCOUNTER — Ambulatory Visit: Payer: BC Managed Care – PPO | Attending: Internal Medicine

## 2020-01-12 ENCOUNTER — Ambulatory Visit: Payer: BC Managed Care – PPO

## 2020-01-12 DIAGNOSIS — Z23 Encounter for immunization: Secondary | ICD-10-CM

## 2020-01-12 NOTE — Progress Notes (Signed)
° °  Covid-19 Vaccination Clinic  Name:  Natasha Larson    MRN: [id number] DOB: 09/24/1980  01/12/2020  [name] was observed post Covid-19 immunization for 15 minutes without incident. She was provided with Vaccine Information Sheet and instruction to access the V-Safe system.   [name] was instructed to call 911 with any severe reactions post vaccine:  Difficulty breathing   Swelling of face and throat   A fast heartbeat   A bad rash all over body   Dizziness and weakness   Immunizations Administered    Name Date Dose VIS Date Route   Moderna COVID-19 Vaccine 01/12/2020  3:38 PM 0.5 mL 04/2019 Intramuscular   Manufacturer: Moderna   Lot: [id number]   NDC: 56812-751-70

## 2020-01-14 ENCOUNTER — Other Ambulatory Visit: Payer: BC Managed Care – PPO

## 2020-01-14 ENCOUNTER — Other Ambulatory Visit: Payer: Self-pay

## 2020-01-14 DIAGNOSIS — Z20822 Contact with and (suspected) exposure to covid-19: Secondary | ICD-10-CM

## 2020-01-15 LAB — SARS-COV-2, NAA 2 DAY TAT

## 2020-01-15 LAB — NOVEL CORONAVIRUS, NAA: SARS-CoV-2, NAA: NOT DETECTED

## 2020-02-16 ENCOUNTER — Other Ambulatory Visit: Payer: BC Managed Care – PPO

## 2020-02-16 DIAGNOSIS — Z20822 Contact with and (suspected) exposure to covid-19: Secondary | ICD-10-CM

## 2020-02-17 LAB — SARS-COV-2, NAA 2 DAY TAT

## 2020-02-17 LAB — NOVEL CORONAVIRUS, NAA: SARS-CoV-2, NAA: NOT DETECTED

## 2020-05-12 ENCOUNTER — Ambulatory Visit
Admission: EM | Admit: 2020-05-12 | Discharge: 2020-05-12 | Disposition: A | Discharge status: Home / Self Care | Payer: BC Managed Care – PPO | Attending: Family Medicine | Admitting: Family Medicine

## 2020-05-12 DIAGNOSIS — L700 Acne vulgaris: Secondary | ICD-10-CM | POA: Diagnosis not present

## 2020-05-12 DIAGNOSIS — N898 Other specified noninflammatory disorders of vagina: Secondary | ICD-10-CM | POA: Diagnosis not present

## 2020-05-12 MED ORDER — METRONIDAZOLE 500 MG PO TABS: 500.0000 mg | ORAL_TABLET | Freq: Two times a day (BID) | ORAL | 0 refills | Status: DC

## 2020-05-12 MED ORDER — METRONIDAZOLE 500 MG PO TABS: 500.0000 mg | ORAL_TABLET | Freq: Two times a day (BID) | ORAL | 0 refills | Status: AC

## 2020-05-12 NOTE — ED Triage Notes (Signed)
Pt presents requesting std testing. Reports vaginal discharge and odor.   Pt also states she has a spot on her right cheek that she would like to get checked out. Reports the area is dark and comes and goes.

## 2020-05-12 NOTE — Discharge Instructions (Signed)
Flagyl as prescribed Swab sent for testing  I believe this acne related  I recommend using benzoyl peroxide. You can this over the counter.  If this continues I would see dermatology.

## 2020-05-13 LAB — CERVICOVAGINAL ANCILLARY ONLY
Bacterial Vaginitis (gardnerella): POSITIVE — AB
Candida Glabrata: NEGATIVE
Candida Vaginitis: NEGATIVE
Chlamydia: NEGATIVE
Comment: NEGATIVE
Comment: NEGATIVE
Comment: NEGATIVE
Comment: NEGATIVE
Comment: NEGATIVE
Comment: NORMAL
Neisseria Gonorrhea: NEGATIVE
Trichomonas: NEGATIVE

## 2020-05-13 NOTE — ED Provider Notes (Signed)
Natasha Larson    CSN: [id number] Arrival date & time: 05/12/20  1245      History   Chief Complaint Chief Complaint  Patient presents with  . SEXUALLY TRANSMITTED DISEASE    HPI Natasha Larson is a 40 y.o. female.   Patient is a 40 year old female who presents today with vaginal discharge, odor.  Describes the discharge as white, milky discharge.  Would like to be checked for STDs. No abdominal pain, back pain, fevers, dysuria, hematuria or urinary frequency. Patient's last menstrual period was 04/19/2020.   She also has sore to the right side of her face.  History of acne.  No fever chills or drainage from the area.     Past Medical History:  Diagnosis Date  . Advanced maternal age in multigravida   . Anemia    LAST HGB 01-26-17 WAS WNL  . Family history of adverse reaction to anesthesia    PT STATES HER DAUGHTERS FACE BECAME SWOLLEN AFTER SURGERY (TONSILLECTOMY) AGE 1 AND STATES IT WAS CAUSED BY THE ANESTHESIA THAT WAS USED  . Gestational diabetes    gestational  . History of prior pregnancy with SGA newborn   . Hx of preeclampsia, prior pregnancy, currently pregnant   . Hypertension     Patient Active Problem List   Diagnosis Date Noted  . Elevated blood pressure affecting pregnancy in third trimester, antepartum 08/10/2016  . Gestational hypertension 08/10/2016  . Tobacco use 07/18/2016  . High-risk pregnancy, elderly multigravida in third trimester 06/27/2016    Past Surgical History:  Procedure Laterality Date  . DILATION AND CURETTAGE OF UTERUS     x2 for SAB and missed AB  . INTRAUTERINE DEVICE (IUD) INSERTION  10/2016   Mirena  . IUD REMOVAL  02/15/2017   Procedure: INTRAUTERINE DEVICE (IUD) REMOVAL;  Surgeon: Vena Austria, MD;  Location: [facility];  Service: Gynecology;;  . LAPAROSCOPIC TUBAL LIGATION Bilateral 02/15/2017   Procedure: LAPAROSCOPIC TUBAL LIGATION;  Surgeon: Vena Austria, MD;  Location: [facility];  Service: Gynecology;   Laterality: Bilateral;    OB History    Gravida  5   Para  3   Term  3   Preterm  0   AB  2   Living  3     SAB  2   IAB  0   Ectopic  0   Multiple  0   Live Births  3            Home Medications    Prior to Admission medications   Medication Sig Start Date End Date Taking? Authorizing Provider  metroNIDAZOLE (FLAGYL) 500 MG tablet Take 1 tablet (500 mg total) by mouth 2 (two) times daily for 7 days. 05/12/20 05/19/20  Dahlia Byes A, NP  labetalol (NORMODYNE) 200 MG tablet Take 1 tablet (200 mg total) by mouth 2 (two) times daily. Patient not taking: Reported on 03/27/2018 08/13/16 05/12/20  Nadara Mustard, MD    Family History Family History  Problem Relation Age of Onset  . Heart failure Mother   . Hypertension Mother   . Breast cancer Other     Social History Social History   Tobacco Use  . Smoking status: Current Every Day Smoker    Packs/day: 0.25    Years: 13.00    Pack years: 3.25    Types: Cigarettes  . Smokeless tobacco: Never Used  . Tobacco comment: 2 cig daily  Vaping Use  . Vaping Use: Never used  Substance Use Topics  . Alcohol use: No  . Drug use: No     Allergies   Patient has no known allergies.   Review of Systems Review of Systems   Physical Exam Triage Vital Signs ED Triage Vitals  Enc Vitals Group     BP 05/12/20 1304 (!) 146/78     Pulse Rate 05/12/20 1304 76     Resp 05/12/20 1304 19     Temp 05/12/20 1304 98 F (36.7 C)     Temp src --      SpO2 05/12/20 1304 100 %     Weight --      Height --      Head Circumference --      Peak Flow --      Pain Score 05/12/20 1302 0     Pain Loc --      Pain Edu? --      Excl. in GC? --    No data found.  Updated Vital Signs BP (!) 146/78   Pulse 76   Temp 98 F (36.7 C)   Resp 19   LMP 04/19/2020   SpO2 100%   Visual Acuity Right Eye Distance:   Left Eye Distance:   Bilateral Distance:    Right Eye Near:   Left Eye Near:    Bilateral Near:      Physical Exam Vitals and nursing note reviewed.  Constitutional:      General: She is not in acute distress.    Appearance: Normal appearance. She is not ill-appearing, toxic-appearing or diaphoretic.  HENT:     Head: Normocephalic.     Nose: Nose normal.  Eyes:     Conjunctiva/sclera: Conjunctivae normal.  Pulmonary:     Effort: Pulmonary effort is normal.  Musculoskeletal:        General: Normal range of motion.     Cervical back: Normal range of motion.  Skin:    General: Skin is warm and dry.     Findings: No rash.     Comments: Pustules generalized to face. A few hyperpigmented areas.   Neurological:     Mental Status: She is alert.  Psychiatric:        Mood and Affect: Mood normal.      UC Treatments / Results  Labs (all labs ordered are listed, but only abnormal results are displayed) Labs Reviewed  CERVICOVAGINAL ANCILLARY ONLY - Abnormal; Notable for the following components:      Result Value   Bacterial Vaginitis (gardnerella) Positive (*)    All other components within normal limits    EKG   Radiology No results found.  Procedures Procedures (including critical care time)  Medications Ordered in UC Medications - No data to display  Initial Impression / Assessment and Plan / UC Course  I have reviewed the triage vital signs and the nursing notes.  Pertinent labs & imaging results that were available during my care of the patient were reviewed by me and considered in my medical decision making (see chart for details).     Vaginal discharge Treating for BV based on symptoms Swab sent for testing.   Cystic acne Recommended benzoyl peroxide See dermatology for continued issues.  Final Clinical Impressions(s) / UC Diagnoses   Final diagnoses:  Vaginal discharge  Cystic acne vulgaris     Discharge Instructions     Flagyl as prescribed Swab sent for testing  I believe this acne related  I recommend using benzoyl peroxide.  You can  this over the counter.  If this continues I would see dermatology.     ED Prescriptions    Medication Sig Dispense Auth. Provider   metroNIDAZOLE (FLAGYL) 500 MG tablet  (Status: Discontinued) Take 1 tablet (500 mg total) by mouth 2 (two) times daily. 14 tablet Eldine Rencher A, NP   metroNIDAZOLE (FLAGYL) 500 MG tablet Take 1 tablet (500 mg total) by mouth 2 (two) times daily for 7 days. 14 tablet Macon Lesesne A, NP     PDMP not reviewed this encounter.   Janace Aris, NP 05/13/20 1305

## 2020-07-06 ENCOUNTER — Encounter: Payer: Self-pay | Admitting: Emergency Medicine

## 2020-07-06 ENCOUNTER — Other Ambulatory Visit: Payer: Self-pay

## 2020-07-06 ENCOUNTER — Ambulatory Visit
Admission: EM | Admit: 2020-07-06 | Discharge: 2020-07-06 | Disposition: A | Discharge status: Home / Self Care | Payer: BC Managed Care – PPO | Attending: Family Medicine | Admitting: Family Medicine

## 2020-07-06 DIAGNOSIS — F17210 Nicotine dependence, cigarettes, uncomplicated: Secondary | ICD-10-CM | POA: Diagnosis not present

## 2020-07-06 DIAGNOSIS — Z113 Encounter for screening for infections with a predominantly sexual mode of transmission: Secondary | ICD-10-CM | POA: Diagnosis not present

## 2020-07-06 NOTE — ED Triage Notes (Signed)
Pt presents today to get tested for STD and has intermittent red spots to right abdomen and left upper leg. She has been told her partner has herpes, but she is not sure.

## 2020-07-06 NOTE — ED Provider Notes (Signed)
[facility]    CSN: [id number] Arrival date & time: 07/06/20  0900      History   Chief Complaint Chief Complaint  Patient presents with  . Exposure to STD  . Rash    HPI Natasha Larson is a 40 y.o. female.   Pt is a 40 year old female that presents with concern for HSV. Reporting that her partner may or may not have HSV. No genital or oral rashes or lesions. Has been having unprotected sex with one partner. Recent negative STD screening.      Past Medical History:  Diagnosis Date  . Advanced maternal age in multigravida   . Anemia    LAST HGB 01-26-17 WAS WNL  . Family history of adverse reaction to anesthesia    PT STATES HER DAUGHTERS FACE BECAME SWOLLEN AFTER SURGERY (TONSILLECTOMY) AGE 16 AND STATES IT WAS CAUSED BY THE ANESTHESIA THAT WAS USED  . Gestational diabetes    gestational  . History of prior pregnancy with SGA newborn   . Hx of preeclampsia, prior pregnancy, currently pregnant   . Hypertension     Patient Active Problem List   Diagnosis Date Noted  . Elevated blood pressure affecting pregnancy in third trimester, antepartum 08/10/2016  . Gestational hypertension 08/10/2016  . Tobacco use 07/18/2016  . High-risk pregnancy, elderly multigravida in third trimester 06/27/2016    Past Surgical History:  Procedure Laterality Date  . DILATION AND CURETTAGE OF UTERUS     x2 for SAB and missed AB  . INTRAUTERINE DEVICE (IUD) INSERTION  10/2016   Mirena  . IUD REMOVAL  02/15/2017   Procedure: INTRAUTERINE DEVICE (IUD) REMOVAL;  Surgeon: Vena Austria, MD;  Location: [facility];  Service: Gynecology;;  . LAPAROSCOPIC TUBAL LIGATION Bilateral 02/15/2017   Procedure: LAPAROSCOPIC TUBAL LIGATION;  Surgeon: Vena Austria, MD;  Location: [facility];  Service: Gynecology;  Laterality: Bilateral;    OB History    Gravida  5   Para  3   Term  3   Preterm  0   AB  2   Living  3     SAB  2   IAB  0   Ectopic  0   Multiple  0    Live Births  3            Home Medications    Prior to Admission medications   Medication Sig Start Date End Date Taking? Authorizing Provider  labetalol (NORMODYNE) 200 MG tablet Take 1 tablet (200 mg total) by mouth 2 (two) times daily. Patient not taking: Reported on 03/27/2018 08/13/16 05/12/20  Nadara Mustard, MD    Family History Family History  Problem Relation Age of Onset  . Heart failure Mother   . Hypertension Mother   . Breast cancer Other     Social History Social History   Tobacco Use  . Smoking status: Current Every Day Smoker    Packs/day: 0.25    Years: 13.00    Pack years: 3.25    Types: Cigarettes  . Smokeless tobacco: Never Used  . Tobacco comment: 2 cig daily  Vaping Use  . Vaping Use: Never used  Substance Use Topics  . Alcohol use: No  . Drug use: No     Allergies   Patient has no known allergies.   Review of Systems Review of Systems   Physical Exam Triage Vital Signs ED Triage Vitals  Enc Vitals Group     BP 07/06/20  1700 121/82     Pulse Rate 07/06/20 0917 88     Resp 07/06/20 0917 18     Temp 07/06/20 0917 98.4 F (36.9 C)     Temp Source 07/06/20 0917 Oral     SpO2 07/06/20 0917 98 %     Weight --      Height --      Head Circumference --      Peak Flow --      Pain Score 07/06/20 0920 0     Pain Loc --      Pain Edu? --      Excl. in GC? --    No data found.  Updated Vital Signs BP 121/82 (BP Location: Left Arm)   Pulse 88   Temp 98.4 F (36.9 C) (Oral)   Resp 18   LMP 06/08/2020   SpO2 98%   Visual Acuity Right Eye Distance:   Left Eye Distance:   Bilateral Distance:    Right Eye Near:   Left Eye Near:    Bilateral Near:     Physical Exam Vitals and nursing note reviewed.  Constitutional:      General: She is not in acute distress.    Appearance: Normal appearance. She is not ill-appearing, toxic-appearing or diaphoretic.  HENT:     Head: Normocephalic.  Eyes:     Conjunctiva/sclera:  Conjunctivae normal.  Pulmonary:     Effort: Pulmonary effort is normal.  Musculoskeletal:        General: Normal range of motion.     Cervical back: Normal range of motion.  Skin:    General: Skin is warm and dry.  Neurological:     Mental Status: She is alert.  Psychiatric:        Mood and Affect: Mood normal.      UC Treatments / Results  Labs (all labs ordered are listed, but only abnormal results are displayed) Labs Reviewed  CERVICOVAGINAL ANCILLARY ONLY    EKG   Radiology No results found.  Procedures Procedures (including critical care time)  Medications Ordered in UC Medications - No data to display  Initial Impression / Assessment and Plan / UC Course  I have reviewed the triage vital signs and the nursing notes.  Pertinent labs & imaging results that were available during my care of the patient were reviewed by me and considered in my medical decision making (see chart for details).     STD screening Spoke with patient and made aware that we are unable to test for HSV without open sores, lesions. Pt understanding.  Requested to go ahead and test for other STDs with self swab Declined testing for HIV and syphilis.  She can check my chart for results.  Final Clinical Impressions(s) / UC Diagnoses   Final diagnoses:  Screen for STD (sexually transmitted disease)     Discharge Instructions     STD screening done here today.  You can check your my chart for results.     ED Prescriptions    None     PDMP not reviewed this encounter.   Janace Aris, NP 07/06/20 1227

## 2020-07-06 NOTE — Discharge Instructions (Addendum)
STD screening done here today.  You can check your my chart for results.

## 2020-07-07 ENCOUNTER — Telehealth (HOSPITAL_COMMUNITY): Payer: Self-pay | Admitting: Emergency Medicine

## 2020-07-07 LAB — CERVICOVAGINAL ANCILLARY ONLY
Bacterial Vaginitis (gardnerella): POSITIVE — AB
Candida Glabrata: NEGATIVE
Candida Vaginitis: POSITIVE — AB
Chlamydia: NEGATIVE
Comment: NEGATIVE
Comment: NEGATIVE
Comment: NEGATIVE
Comment: NEGATIVE
Comment: NEGATIVE
Comment: NORMAL
Neisseria Gonorrhea: NEGATIVE
Trichomonas: NEGATIVE

## 2020-07-07 MED ORDER — METRONIDAZOLE 500 MG PO TABS: 500.0000 mg | ORAL_TABLET | Freq: Two times a day (BID) | ORAL | 0 refills | Status: DC

## 2020-07-07 MED ORDER — FLUCONAZOLE 150 MG PO TABS: 150.0000 mg | ORAL_TABLET | Freq: Once | ORAL | 0 refills | Status: AC

## 2020-08-31 ENCOUNTER — Telehealth: Payer: Self-pay

## 2020-08-31 NOTE — Telephone Encounter (Signed)
Pt calling to see how to go about getting her tubes untied.  [phone]

## 2020-09-01 NOTE — Telephone Encounter (Signed)
Correct or [facility] in [place] etc

## 2020-09-01 NOTE — Telephone Encounter (Signed)
She would need to make an appointment with a reproductive endocrinologist

## 2020-09-02 NOTE — Telephone Encounter (Signed)
LMTC

## 2020-09-02 NOTE — Telephone Encounter (Signed)
Mailbox is full.  Will send msg in MyChart.

## 2020-11-05 ENCOUNTER — Other Ambulatory Visit: Payer: Self-pay

## 2020-11-05 ENCOUNTER — Ambulatory Visit: Payer: BC Managed Care – PPO

## 2020-11-05 ENCOUNTER — Telehealth: Payer: Self-pay

## 2020-11-05 NOTE — Telephone Encounter (Signed)
Pt showed up this afternoon at our office for a depo nurse visit. She thought we had depo for her in office. She said AMS advised her cycles were going to be heavy after tubal ligation, and they are very heavy. Would like depo Rx. Please advise. Pt aware AMS out of the office this week and it wouldn't be until next week when she hears from [place].

## 2020-11-08 NOTE — Telephone Encounter (Signed)
We have not seen her since 2019, her tubal was 4 years ago 2018.  She needs to be seen before we just start her on Depo.  This is a non urgent visit she also needs an annual so she can have the next available annual ok if it is 4 week out

## 2020-11-09 NOTE — Telephone Encounter (Signed)
Patient is scheduled for 12/03/20 with AMS for annual

## 2020-11-09 NOTE — Telephone Encounter (Signed)
Called and left voicemail for patient to call back to be scheduled. 

## 2020-11-11 ENCOUNTER — Ambulatory Visit: Payer: Self-pay

## 2020-12-03 ENCOUNTER — Ambulatory Visit: Payer: BC Managed Care – PPO | Admitting: Obstetrics and Gynecology

## 2020-12-09 ENCOUNTER — Ambulatory Visit: Payer: BC Managed Care – PPO

## 2020-12-09 ENCOUNTER — Encounter: Payer: Self-pay | Admitting: Obstetrics and Gynecology

## 2020-12-09 ENCOUNTER — Other Ambulatory Visit (HOSPITAL_COMMUNITY)
Admission: RE | Admit: 2020-12-09 | Discharge: 2020-12-09 | Disposition: A | Discharge status: Home / Self Care | Payer: BC Managed Care – PPO | Source: Ambulatory Visit | Attending: Obstetrics and Gynecology | Admitting: Obstetrics and Gynecology

## 2020-12-09 ENCOUNTER — Other Ambulatory Visit: Payer: Self-pay

## 2020-12-09 ENCOUNTER — Ambulatory Visit (INDEPENDENT_AMBULATORY_CARE_PROVIDER_SITE_OTHER): Payer: BC Managed Care – PPO | Admitting: Obstetrics and Gynecology

## 2020-12-09 VITALS — BP 120/68 | HR 98 | Ht 62.0 in | Wt 137.0 lb

## 2020-12-09 DIAGNOSIS — Z124 Encounter for screening for malignant neoplasm of cervix: Secondary | ICD-10-CM

## 2020-12-09 DIAGNOSIS — Z113 Encounter for screening for infections with a predominantly sexual mode of transmission: Secondary | ICD-10-CM | POA: Insufficient documentation

## 2020-12-09 DIAGNOSIS — Z01419 Encounter for gynecological examination (general) (routine) without abnormal findings: Secondary | ICD-10-CM

## 2020-12-09 DIAGNOSIS — N939 Abnormal uterine and vaginal bleeding, unspecified: Secondary | ICD-10-CM

## 2020-12-09 DIAGNOSIS — Z3169 Encounter for other general counseling and advice on procreation: Secondary | ICD-10-CM | POA: Diagnosis not present

## 2020-12-09 DIAGNOSIS — Z1239 Encounter for other screening for malignant neoplasm of breast: Secondary | ICD-10-CM

## 2020-12-09 MED ORDER — MEDROXYPROGESTERONE ACETATE 150 MG/ML IM SUSP: 150.0000 mg | INTRAMUSCULAR | 3 refills | Status: AC

## 2020-12-09 NOTE — Progress Notes (Signed)
Gynecology Annual Exam  PCP: [facility]  Chief Complaint:  Chief Complaint  Patient presents with   Gynecologic Exam    Annual - requesting depo. RM 5    History of Present Illness: Patient is a 40 y.o. Natasha Larson presents for annual exam. The patient has no complaints today.   LMP: Patient's last menstrual period was 11/18/2020. No menstrual complaints   The patient is sexually active. She currently uses none for contraception. She denies dyspareunia.  The patient does perform self breast exams.  There is no notable family history of breast or ovarian cancer in her family.  The patient wears seatbelts: yes.   The patient has regular exercise: not asked.    The patient denies current symptoms of depression.    Review of Systems: Review of Systems  Constitutional:  Negative for chills and fever.  HENT:  Negative for congestion.   Respiratory:  Negative for cough and shortness of breath.   Cardiovascular:  Negative for chest pain and palpitations.  Gastrointestinal:  Negative for abdominal pain, constipation, diarrhea, heartburn, nausea and vomiting.  Genitourinary:  Negative for dysuria, frequency and urgency.  Skin:  Negative for itching and rash.  Neurological:  Negative for dizziness and headaches.  Endo/Heme/Allergies:  Negative for polydipsia.  Psychiatric/Behavioral:  Negative for depression.    Past Medical History:  Patient Active Problem List   Diagnosis Date Noted   Elevated blood pressure affecting pregnancy in third trimester, antepartum 08/10/2016   Gestational hypertension 08/10/2016   Tobacco use 07/18/2016   High-risk pregnancy, elderly multigravida in third trimester 06/27/2016    Past Surgical History:  Past Surgical History:  Procedure Laterality Date   DILATION AND CURETTAGE OF UTERUS     x2 for SAB and missed AB   INTRAUTERINE DEVICE (IUD) INSERTION  10/2016   Mirena   IUD REMOVAL  02/15/2017   Procedure: INTRAUTERINE DEVICE (IUD)  REMOVAL;  Surgeon: Vena Austria, MD;  Location: [facility];  Service: Gynecology;;   LAPAROSCOPIC TUBAL LIGATION Bilateral 02/15/2017   Procedure: LAPAROSCOPIC TUBAL LIGATION;  Surgeon: Vena Austria, MD;  Location: [facility];  Service: Gynecology;  Laterality: Bilateral;    Gynecologic History:  Patient's last menstrual period was 11/18/2020. Contraception: none Last Pap: Results were:  5/23/20218 NILM HPV negative Last mammogram: no previously obtained  Obstetric History: [id number]  Family History:  Family History  Problem Relation Age of Onset   Heart failure Mother    Hypertension Mother    Breast cancer Other     Social History:  Social History   Socioeconomic History   Marital status: Single    Spouse name: Not on file   Number of children: Not on file   Years of education: Not on file   Highest education level: Not on file  Occupational History   Not on file  Tobacco Use   Smoking status: Every Day    Packs/day: 0.25    Years: 13.00    Pack years: 3.25    Types: Cigarettes   Smokeless tobacco: Never   Tobacco comments:    2 cig daily  Vaping Use   Vaping Use: Never used  Substance and Sexual Activity   Alcohol use: No   Drug use: No   Sexual activity: Yes    Birth control/protection: Surgical  Other Topics Concern   Not on file  Social History Narrative   Not on file   Social Determinants of Health   Financial Resource Strain: Not on file  Food Insecurity: Not on file  Transportation Needs: Not on file  Physical Activity: Not on file  Stress: Not on file  Social Connections: Not on file  Intimate Partner Violence: Not on file    Allergies:  No Known Allergies  Medications: Prior to Admission medications   Medication Sig Start Date End Date Taking? Authorizing Provider  labetalol (NORMODYNE) 200 MG tablet Take 1 tablet (200 mg total) by mouth 2 (two) times daily. Patient not taking: Reported on 03/27/2018 08/13/16 05/12/20  Nadara Mustard, MD    Physical Exam Vitals: Blood pressure 120/68, pulse 98, height 5\' 2"  (1.575 m), weight 137 lb (62.1 kg), last menstrual period 11/18/2020.  General: NAD HEENT: normocephalic, anicteric Thyroid: no enlargement, no palpable nodules Pulmonary: No increased work of breathing, CTAB Cardiovascular: RRR, distal pulses 2+ Breast: Breast symmetrical, no tenderness, no palpable nodules or masses, no skin or nipple retraction present, no nipple discharge.  No axillary or supraclavicular lymphadenopathy. Abdomen: NABS, soft, non-tender, non-distended.  Umbilicus without lesions.  No hepatomegaly, splenomegaly or masses palpable. No evidence of hernia  Genitourinary:  External: Normal external female genitalia.  Normal urethral meatus, normal Bartholin's and Skene's glands.    Vagina: Normal vaginal mucosa, no evidence of prolapse.    Cervix: Grossly normal in appearance, no bleeding  Uterus: Non-enlarged, mobile, normal contour.  No CMT  Adnexa: ovaries non-enlarged, no adnexal masses  Rectal: deferred  Lymphatic: no evidence of inguinal lymphadenopathy Extremities: no edema, erythema, or tenderness Neurologic: Grossly intact Psychiatric: mood appropriate, affect full  Female chaperone present for pelvic and breast  portions of the physical exam    Assessment: 40 y.o. 43 routine annual exam  Plan: Problem List Items Addressed This Visit   None Visit Diagnoses     Encounter for gynecological examination without abnormal finding    -  Primary   Screening for malignant neoplasm of cervix       Relevant Orders   Cytology - PAP (Completed)   Breast screening       Relevant Orders   MM 3D SCREEN BREAST BILATERAL   Routine screening for STI (sexually transmitted infection)       Relevant Orders   Cytology - PAP (Completed)   Encounter for preconception consultation       Relevant Orders   Anti mullerian hormone (Completed)   Abnormal uterine bleeding       Relevant Orders    Anti mullerian hormone (Completed)       1) Mammogram - recommend yearly screening mammogram.  Mammogram Was ordered today   2) STI screening  was notoffered and therefore not obtained  3) ASCCP guidelines and rational discussed.  Patient opts for every 3 years screening interval  4) Contraception - the patient is currently using  none.  She is interested in changing to Depo Provera - is done child bearing but significant other is interested in potential one more child - will check AMH to see ovarian reserve to see if this is even feasible  5) Colonoscopy -- Screening recommended starting at age 8 for average risk individuals, age 56 for individuals deemed at increased risk (including African Americans) and recommended to continue until age 36.  For patient age 31-85 individualized approach is recommended.  Gold standard screening is via colonoscopy, Cologuard screening is an acceptable alternative for patient unwilling or unable to undergo colonoscopy.  "Colorectal cancer screening for average?risk adults: 2018 guideline update from the American Cancer Society"CA: A Cancer Journal for Clinicians:  Sep 27, 2016   6) Routine healthcare maintenance including cholesterol, diabetes screening discussed managed by PCP  7) Return in about 1 year (around 12/09/2021) for  annual, call with next menses for depo injection.   Vena Austria, MD, [facility], [facility] 12/09/2020, 11:01 AM

## 2020-12-09 NOTE — Patient Instructions (Signed)
[facility] [address] [place] [address]  [facility] [place]  [address] [place] [address]  Phone: [phone]  

## 2020-12-14 LAB — ANTI MULLERIAN HORMONE: ANTI-MULLERIAN HORMONE (AMH): 0.164 ng/mL

## 2020-12-15 LAB — CYTOLOGY - PAP
Comment: NEGATIVE
Diagnosis: NEGATIVE
High risk HPV: NEGATIVE

## 2020-12-16 ENCOUNTER — Ambulatory Visit (INDEPENDENT_AMBULATORY_CARE_PROVIDER_SITE_OTHER): Payer: BC Managed Care – PPO

## 2020-12-16 ENCOUNTER — Other Ambulatory Visit: Payer: Self-pay

## 2020-12-16 DIAGNOSIS — Z3042 Encounter for surveillance of injectable contraceptive: Secondary | ICD-10-CM | POA: Diagnosis not present

## 2020-12-16 MED ORDER — MEDROXYPROGESTERONE ACETATE 150 MG/ML IM SUSP
150.0000 mg | Freq: Once | INTRAMUSCULAR | Status: AC
Start: 2020-12-16 — End: 2020-12-16
  Administered 2020-12-16: 150 mg via INTRAMUSCULAR

## 2020-12-16 NOTE — Progress Notes (Signed)
Pt here for 1st depo inj which was given IM left deltoid.  Pt started menses today.  NDC# [phone]

## 2021-03-11 ENCOUNTER — Ambulatory Visit (INDEPENDENT_AMBULATORY_CARE_PROVIDER_SITE_OTHER): Payer: BC Managed Care – PPO

## 2021-03-11 ENCOUNTER — Other Ambulatory Visit: Payer: Self-pay

## 2021-03-11 DIAGNOSIS — Z3042 Encounter for surveillance of injectable contraceptive: Secondary | ICD-10-CM

## 2021-03-11 MED ORDER — MEDROXYPROGESTERONE ACETATE 150 MG/ML IM SUSP
150.0000 mg | Freq: Once | INTRAMUSCULAR | Status: AC
Start: 2021-03-11 — End: 2021-03-11
  Administered 2021-03-11: 150 mg via INTRAMUSCULAR

## 2021-03-11 NOTE — Progress Notes (Signed)
Pt here for depo which was given IM right glut.  Pt wriggled pretty big so that the needle was at an angle upward after being inserted IM.  I apologized; she said it was okay; she jumps even when it's given in the arm.  NDC# [phone]

## 2021-05-13 ENCOUNTER — Ambulatory Visit: Payer: BC Managed Care – PPO

## 2021-05-31 ENCOUNTER — Ambulatory Visit (INDEPENDENT_AMBULATORY_CARE_PROVIDER_SITE_OTHER): Payer: BC Managed Care – PPO

## 2021-05-31 ENCOUNTER — Other Ambulatory Visit: Payer: Self-pay

## 2021-05-31 DIAGNOSIS — Z3042 Encounter for surveillance of injectable contraceptive: Secondary | ICD-10-CM

## 2021-05-31 MED ORDER — MEDROXYPROGESTERONE ACETATE 150 MG/ML IM SUSP
150.0000 mg | Freq: Once | INTRAMUSCULAR | Status: AC
  Administered 2021-05-31: 150 mg via INTRAMUSCULAR

## 2021-05-31 NOTE — Progress Notes (Signed)
Pt here for depo which was given IM left glut.  Pt was on phone with a female while inj given; pt jumped with needle insertion but not away from needle; told person on phone she didn't like needles and was going to give it another inj for bleeding to straighten out; pt states she doesn't bleed so heavy that she saturates a pad every 92min-1hr but bleeds more than she wants to; it usually starts near the end of her window thru to about 2-3wks after inj.  Adv can schedule appt to discuss c provider; pt states she's going to wait and see how she does with this shot.  NDC# [phone]

## 2021-08-25 ENCOUNTER — Ambulatory Visit (INDEPENDENT_AMBULATORY_CARE_PROVIDER_SITE_OTHER): Payer: BC Managed Care – PPO

## 2021-08-25 DIAGNOSIS — Z3042 Encounter for surveillance of injectable contraceptive: Secondary | ICD-10-CM | POA: Diagnosis not present

## 2021-08-25 MED ORDER — MEDROXYPROGESTERONE ACETATE 150 MG/ML IM SUSP
150.0000 mg | Freq: Once | INTRAMUSCULAR | Status: AC
  Administered 2021-08-25: 150 mg via INTRAMUSCULAR

## 2021-11-11 ENCOUNTER — Ambulatory Visit (INDEPENDENT_AMBULATORY_CARE_PROVIDER_SITE_OTHER): Payer: BC Managed Care – PPO

## 2021-11-11 DIAGNOSIS — Z3042 Encounter for surveillance of injectable contraceptive: Secondary | ICD-10-CM | POA: Diagnosis not present

## 2021-11-11 MED ORDER — MEDROXYPROGESTERONE ACETATE 150 MG/ML IM SUSP
150.0000 mg | Freq: Once | INTRAMUSCULAR | Status: AC
  Administered 2021-11-11: 150 mg via INTRAMUSCULAR

## 2021-11-11 NOTE — Patient Instructions (Signed)
Medroxyprogesterone Injection (Contraception) ?What is this medication? ?MEDROXYPROGESTERONE (me DROX ee proe JES te rone) prevents ovulation and pregnancy. It belongs to a group of medications called contraceptives. This medication is a progestin hormone. ?This medicine may be used for other purposes; ask your health care provider or pharmacist if you have questions. ?COMMON BRAND NAME(S): Depo-Provera, Depo-subQ Provera 104 ?What should I tell my care team before I take this medication? ?They need to know if you have any of these conditions: ?Asthma ?Blood clots ?Breast cancer or family history of breast cancer ?Depression ?Diabetes ?Eating disorder (anorexia nervosa) ?Heart attack ?High blood pressure ?HIV infection or AIDS ?If you often drink alcohol ?Kidney disease ?Liver disease ?Migraine headaches ?Osteoporosis, weak bones ?Seizures ?Stroke ?Tobacco smoker ?Vaginal bleeding ?An unusual or allergic reaction to medroxyprogesterone, other hormones, medications, foods, dyes, or preservatives ?Pregnant or trying to get pregnant ?Breast-feeding ?How should I use this medication? ?Depo-Provera CI contraceptive injection is given into a muscle. Depo-subQ Provera 104 injection is given under the skin. It is given in a hospital or clinic setting. The injection is usually given during the first 5 days after the start of a menstrual period or 6 weeks after delivery of a baby. ?A patient package insert for the product will be given with each prescription and refill. Be sure to read this information carefully each time. The sheet may change often. ?Talk to your care team about the use of this medication in children. Special care may be needed. These injections have been used in female children who have started having menstrual periods. ?Overdosage: If you think you have taken too much of this medicine contact a poison control center or emergency room at once. ?NOTE: This medicine is only for you. Do not share this medicine  with others. ?What if I miss a dose? ?Keep appointments for follow-up doses. You must get an injection once every 3 months. It is important not to miss your dose. Call your care team if you are unable to keep an appointment. ?What may interact with this medication? ?Antibiotics or medications for infections, especially rifampin and griseofulvin ?Antivirals for HIV or hepatitis ?Aprepitant ?Armodafinil ?Bexarotene ?Bosentan ?Medications for seizures like carbamazepine, felbamate, oxcarbazepine, phenytoin, phenobarbital, primidone, topiramate ?Mitotane ?Modafinil ?St. John's wort ?This list may not describe all possible interactions. Give your health care provider a list of all the medicines, herbs, non-prescription drugs, or dietary supplements you use. Also tell them if you smoke, drink alcohol, or use illegal drugs. Some items may interact with your medicine. ?What should I watch for while using this medication? ?This medication does not protect you against HIV infection (AIDS) or other sexually transmitted diseases. ?Use of this product may cause you to lose calcium from your bones. Loss of calcium may cause weak bones (osteoporosis). Only use this product for more than 2 years if other forms of birth control are not right for you. The longer you use this product for birth control the more likely you will be at risk for weak bones. Ask your care team how you can keep strong bones. ?You may have a change in bleeding pattern or irregular periods. Many females stop having periods while taking this medication. ?If you have received your injections on time, your chance of being pregnant is very low. If you think you may be pregnant, see your care team as soon as possible. ?Tell your care team if you want to get pregnant within the next year. The effect of this medication may last a   long time after you get your last injection. ?What side effects may I notice from receiving this medication? ?Side effects that you should  report to your care team as soon as possible: ?Allergic reactions--skin rash, itching, hives, swelling of the face, lips, tongue, or throat ?Blood clot--pain, swelling, or warmth in the leg, shortness of breath, chest pain ?Gallbladder problems--severe stomach pain, nausea, vomiting, fever ?Increase in blood pressure ?Liver injury--right upper belly pain, loss of appetite, nausea, light-colored stool, dark yellow or brown urine, yellowing skin or eyes, unusual weakness or fatigue ?New or worsening migraines or headaches ?Seizures ?Stroke--sudden numbness or weakness of the face, arm, or leg, trouble speaking, confusion, trouble walking, loss of balance or coordination, dizziness, severe headache, change in vision ?Unusual vaginal discharge, itching, or odor ?Worsening mood, feelings of depression ?Side effects that usually do not require medical attention (report to your care team if they continue or are bothersome): ?Breast pain or tenderness ?Dark patches of the skin on the face or other sun-exposed areas ?Irregular menstrual cycles or spotting ?Nausea ?Weight gain ?This list may not describe all possible side effects. Call your doctor for medical advice about side effects. You may report side effects to FDA at 1-800-FDA-1088. ?Where should I keep my medication? ?This injection is only given by a care team. It will not be stored at home. ?NOTE: This sheet is a summary. It may not cover all possible information. If you have questions about this medicine, talk to your doctor, pharmacist, or health care provider. ?? 2023 Elsevier/Gold Standard (2020-06-20 00:00:00) ? ?

## 2021-11-11 NOTE — Progress Notes (Signed)
Date last pap: 12/09/20. Last Depo-Provera: 08/25/21. Side Effects if any: none reported. Serum HCG indicated? no. Depo-Provera 150 mg IM given by: Nicholos Johns. W, NCMA. Next appointment due September 29-October 13,2023.

## 2021-12-12 ENCOUNTER — Encounter: Payer: Self-pay | Admitting: Obstetrics & Gynecology

## 2021-12-12 ENCOUNTER — Ambulatory Visit (INDEPENDENT_AMBULATORY_CARE_PROVIDER_SITE_OTHER): Payer: BC Managed Care – PPO | Admitting: Obstetrics & Gynecology

## 2021-12-12 VITALS — BP 110/80 | Ht 62.0 in | Wt 140.0 lb

## 2021-12-12 DIAGNOSIS — Z01419 Encounter for gynecological examination (general) (routine) without abnormal findings: Secondary | ICD-10-CM | POA: Diagnosis not present
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
## 2022-02-02 ENCOUNTER — Ambulatory Visit (INDEPENDENT_AMBULATORY_CARE_PROVIDER_SITE_OTHER): Payer: BC Managed Care – PPO

## 2022-02-02 VITALS — BP 142/100 | HR 95 | Resp 16 | Ht 62.0 in | Wt 141.0 lb

## 2022-02-02 DIAGNOSIS — Z3042 Encounter for surveillance of injectable contraceptive: Secondary | ICD-10-CM | POA: Diagnosis not present

## 2022-02-02 MED ORDER — MEDROXYPROGESTERONE ACETATE 150 MG/ML IM SUSP
150.0000 mg | INTRAMUSCULAR | Status: AC
  Administered 2022-02-02 – 2022-07-05 (×2): 150 mg via INTRAMUSCULAR

## 2022-02-02 NOTE — Progress Notes (Cosign Needed Addendum)
Date last pap: 12/09/2020. Last Depo-Provera: 11/11/2021. Side Effects if any: None. Serum HCG indicated? N/A. Depo-Provera 150 mg IM given by: Cristy Folks, CMA. Next appointment due: Dec 21 - Jan. 4

## 2022-02-02 NOTE — Patient Instructions (Signed)
Medroxyprogesterone Injection (Contraception) ?What is this medication? ?MEDROXYPROGESTERONE (me DROX ee proe JES te rone) prevents ovulation and pregnancy. It belongs to a group of medications called contraceptives. This medication is a progestin hormone. ?This medicine may be used for other purposes; ask your health care provider or pharmacist if you have questions. ?COMMON BRAND NAME(S): Depo-Provera, Depo-subQ Provera 104 ?What should I tell my care team before I take this medication? ?They need to know if you have any of these conditions: ?Asthma ?Blood clots ?Breast cancer or family history of breast cancer ?Depression ?Diabetes ?Eating disorder (anorexia nervosa) ?Heart attack ?High blood pressure ?HIV infection or AIDS ?If you often drink alcohol ?Kidney disease ?Liver disease ?Migraine headaches ?Osteoporosis, weak bones ?Seizures ?Stroke ?Tobacco smoker ?Vaginal bleeding ?An unusual or allergic reaction to medroxyprogesterone, other hormones, medications, foods, dyes, or preservatives ?Pregnant or trying to get pregnant ?Breast-feeding ?How should I use this medication? ?Depo-Provera CI contraceptive injection is given into a muscle. Depo-subQ Provera 104 injection is given under the skin. It is given in a hospital or clinic setting. The injection is usually given during the first 5 days after the start of a menstrual period or 6 weeks after delivery of a baby. ?A patient package insert for the product will be given with each prescription and refill. Be sure to read this information carefully each time. The sheet may change often. ?Talk to your care team about the use of this medication in children. Special care may be needed. These injections have been used in female children who have started having menstrual periods. ?Overdosage: If you think you have taken too much of this medicine contact a poison control center or emergency room at once. ?NOTE: This medicine is only for you. Do not share this medicine  with others. ?What if I miss a dose? ?Keep appointments for follow-up doses. You must get an injection once every 3 months. It is important not to miss your dose. Call your care team if you are unable to keep an appointment. ?What may interact with this medication? ?Antibiotics or medications for infections, especially rifampin and griseofulvin ?Antivirals for HIV or hepatitis ?Aprepitant ?Armodafinil ?Bexarotene ?Bosentan ?Medications for seizures like carbamazepine, felbamate, oxcarbazepine, phenytoin, phenobarbital, primidone, topiramate ?Mitotane ?Modafinil ?St. John's wort ?This list may not describe all possible interactions. Give your health care provider a list of all the medicines, herbs, non-prescription drugs, or dietary supplements you use. Also tell them if you smoke, drink alcohol, or use illegal drugs. Some items may interact with your medicine. ?What should I watch for while using this medication? ?This medication does not protect you against HIV infection (AIDS) or other sexually transmitted diseases. ?Use of this product may cause you to lose calcium from your bones. Loss of calcium may cause weak bones (osteoporosis). Only use this product for more than 2 years if other forms of birth control are not right for you. The longer you use this product for birth control the more likely you will be at risk for weak bones. Ask your care team how you can keep strong bones. ?You may have a change in bleeding pattern or irregular periods. Many females stop having periods while taking this medication. ?If you have received your injections on time, your chance of being pregnant is very low. If you think you may be pregnant, see your care team as soon as possible. ?Tell your care team if you want to get pregnant within the next year. The effect of this medication may last a   long time after you get your last injection. ?What side effects may I notice from receiving this medication? ?Side effects that you should  report to your care team as soon as possible: ?Allergic reactions--skin rash, itching, hives, swelling of the face, lips, tongue, or throat ?Blood clot--pain, swelling, or warmth in the leg, shortness of breath, chest pain ?Gallbladder problems--severe stomach pain, nausea, vomiting, fever ?Increase in blood pressure ?Liver injury--right upper belly pain, loss of appetite, nausea, light-colored stool, dark yellow or brown urine, yellowing skin or eyes, unusual weakness or fatigue ?New or worsening migraines or headaches ?Seizures ?Stroke--sudden numbness or weakness of the face, arm, or leg, trouble speaking, confusion, trouble walking, loss of balance or coordination, dizziness, severe headache, change in vision ?Unusual vaginal discharge, itching, or odor ?Worsening mood, feelings of depression ?Side effects that usually do not require medical attention (report to your care team if they continue or are bothersome): ?Breast pain or tenderness ?Dark patches of the skin on the face or other sun-exposed areas ?Irregular menstrual cycles or spotting ?Nausea ?Weight gain ?This list may not describe all possible side effects. Call your doctor for medical advice about side effects. You may report side effects to FDA at 1-800-FDA-1088. ?Where should I keep my medication? ?This injection is only given by a care team. It will not be stored at home. ?NOTE: This sheet is a summary. It may not cover all possible information. If you have questions about this medicine, talk to your doctor, pharmacist, or health care provider. ?? 2023 Elsevier/Gold Standard (2020-06-20 00:00:00) ? ?

## 2022-04-20 ENCOUNTER — Ambulatory Visit (INDEPENDENT_AMBULATORY_CARE_PROVIDER_SITE_OTHER): Payer: Medicaid Other

## 2022-04-20 VITALS — BP 147/88 | HR 103 | Ht 62.0 in | Wt 142.0 lb

## 2022-04-20 DIAGNOSIS — Z3042 Encounter for surveillance of injectable contraceptive: Secondary | ICD-10-CM | POA: Diagnosis not present

## 2022-04-20 MED ORDER — MEDROXYPROGESTERONE ACETATE 150 MG/ML IM SUSP
150.0000 mg | Freq: Once | INTRAMUSCULAR | Status: AC
  Administered 2022-04-20: 150 mg via INTRAMUSCULAR

## 2022-04-20 NOTE — Progress Notes (Signed)
Date last pap: 12/09/2020. Last Depo-Provera: 02/02/2021. Side Effects if any: na. Serum HCG indicated? na. Depo-Provera 150 mg IM given by: Beverely Pace CMA. Next appointment due March 8-22.

## 2022-07-04 ENCOUNTER — Telehealth: Payer: Self-pay

## 2022-07-04 NOTE — Telephone Encounter (Signed)
Natasha Larson called triage confirming her nurse visit appointment for 07/05/2022.

## 2022-07-05 ENCOUNTER — Ambulatory Visit (INDEPENDENT_AMBULATORY_CARE_PROVIDER_SITE_OTHER): Payer: Medicaid Other

## 2022-07-05 VITALS — BP 176/113 | HR 96 | Ht 62.0 in | Wt 145.8 lb

## 2022-07-05 DIAGNOSIS — Z3042 Encounter for surveillance of injectable contraceptive: Secondary | ICD-10-CM | POA: Insufficient documentation

## 2022-07-05 NOTE — Patient Instructions (Signed)

## 2022-07-05 NOTE — Progress Notes (Signed)
    NURSE VISIT NOTE  Subjective:    Patient ID: Natasha Larson, female    DOB: December 01, 1980, 42 y.o.   MRN: [id number]  HPI  Patient is a 42 y.o. [id number] female who presents for depo provera injection.   Objective:    BP (!) 176/113   Pulse 96   Ht '5\' 2"'$  (1.575 m)   Wt 145 lb 12.8 oz (66.1 kg)   BMI 26.67 kg/m   Last Annual: 12/12/2021. Last pap: 12/09/2020. Last Depo-Provera: 04/20/2022. Side Effects if any: none. Serum HCG indicated? No . Depo-Provera 150 mg IM given by: Otila Kluver, LPN. Site: Left Upper Outer Quandrant  Lab Review   Assessment:   1. Encounter for surveillance of injectable contraceptive      Plan:   Next appointment due between 09/20/2022 and 10/04/2022. Patient to have BP Check at Urgent Care next week per Dr. Marcelline Mates.   Otila Kluver, LPN

## 2022-09-22 ENCOUNTER — Ambulatory Visit (INDEPENDENT_AMBULATORY_CARE_PROVIDER_SITE_OTHER): Payer: 59

## 2022-09-22 ENCOUNTER — Ambulatory Visit: Payer: 59

## 2022-09-22 VITALS — BP 146/90 | Ht 62.0 in | Wt 151.0 lb

## 2022-09-22 DIAGNOSIS — Z3042 Encounter for surveillance of injectable contraceptive: Secondary | ICD-10-CM | POA: Diagnosis not present

## 2022-09-22 MED ORDER — MEDROXYPROGESTERONE ACETATE 150 MG/ML IM SUSY
150.0000 mg | PREFILLED_SYRINGE | Freq: Once | INTRAMUSCULAR | Status: AC
  Administered 2022-09-22: 150 mg via INTRAMUSCULAR

## 2022-09-22 NOTE — Patient Instructions (Signed)

## 2022-09-22 NOTE — Progress Notes (Signed)
    NURSE VISIT NOTE  Subjective:    Patient ID: Natasha Larson, female    DOB: October 05, 1980, 41 y.o.   MRN: [id number]  HPI  Patient is a 42 y.o. [name] female who presents for depo provera injection.   Objective:    BP (!) 146/90   Ht 5\' 2"  (1.575 m)   Wt 151 lb (68.5 kg)   BMI 27.62 kg/m   Last Annual: 12/12/21. Last pap: 12/09/20. Last Depo-Provera: 07/05/22. Side Effects if any: none. Serum HCG indicated? No . Depo-Provera 150 mg IM given by: Donnetta Hail, CMA. Site: Right Upper Outer Quandrant   Assessment:   1. Encounter for surveillance of injectable contraceptive      Plan:   Next appointment due between Aug 9 and Aug 23.    Donnetta Hail, CMA

## 2022-12-08 ENCOUNTER — Ambulatory Visit (INDEPENDENT_AMBULATORY_CARE_PROVIDER_SITE_OTHER): Payer: 59

## 2022-12-08 VITALS — BP 149/100 | HR 100 | Wt 148.8 lb

## 2022-12-08 DIAGNOSIS — Z3042 Encounter for surveillance of injectable contraceptive: Secondary | ICD-10-CM | POA: Diagnosis not present

## 2022-12-08 MED ORDER — MEDROXYPROGESTERONE ACETATE 150 MG/ML IM SUSY
150.0000 mg | PREFILLED_SYRINGE | Freq: Once | INTRAMUSCULAR | Status: AC
  Administered 2022-12-08: 150 mg via INTRAMUSCULAR

## 2022-12-08 NOTE — Progress Notes (Signed)
    NURSE VISIT NOTE  Subjective:    Patient ID: Natasha Larson, female    DOB: 01/22/1981, 42 y.o.   MRN: [id number]  HPI  Patient is a 42 y.o. [id number] female who presents for depo provera injection.  Objective:    BP (!) 135/96   Pulse 100   Wt 148 lb 12.8 oz (67.5 kg)   BMI 27.22 kg/m   Last Annual: 12/12/2021. Last pap: 12/09/2020. Last Depo-Provera: 09/22/2022. Side Effects if any: none. Serum HCG indicated? No . Depo-Provera 150 mg IM given by: Doristine Devoid, CMA. Site: Right Upper Outer Quandrant  Lab Review  @THIS  VISIT ONLY@  Assessment:   1. Encounter for surveillance of injectable contraceptive      Plan:   Next appointment due between October 25th and November 8th.    Burtis Junes, CMA

## 2023-02-23 ENCOUNTER — Ambulatory Visit: Payer: 59

## 2023-02-23 ENCOUNTER — Ambulatory Visit (INDEPENDENT_AMBULATORY_CARE_PROVIDER_SITE_OTHER): Payer: 59

## 2023-02-23 VITALS — BP 161/106 | HR 105 | Ht 62.0 in | Wt 146.1 lb

## 2023-02-23 DIAGNOSIS — Z3042 Encounter for surveillance of injectable contraceptive: Secondary | ICD-10-CM

## 2023-02-23 MED ORDER — MEDROXYPROGESTERONE ACETATE 150 MG/ML IM SUSP
150.0000 mg | Freq: Once | INTRAMUSCULAR | Status: AC
  Administered 2023-02-23: 150 mg via INTRAMUSCULAR

## 2023-02-23 NOTE — Progress Notes (Signed)
    NURSE VISIT NOTE  Subjective:    Patient ID: RHONDI NOLD, female    DOB: Sep 27, 1980, 42 y.o.   MRN: [id number]  HPI  Patient is a 42 y.o. [id number] female who presents for depo provera injection.   Objective:    BP (!) 161/106   Pulse (!) 105   Ht 5\' 2"  (1.575 m)   Wt 146 lb 1.6 oz (66.3 kg)   BMI 26.72 kg/m   Last Annual: 12/12/21. Last pap: 12/09/20. Last Depo-Provera: 12/08/22. Side Effects if any: N/A. Serum HCG indicated? N/a. Depo-Provera 150 mg IM given by: Georgiana Shore, CMA. Site: Left Upper Outer Quandrant    Assessment:   1. Encounter for surveillance of injectable contraceptive      Plan:   Next appointment due between 05/11/23 and 05/25/23.    Loman Chroman, CMA

## 2023-04-25 ENCOUNTER — Emergency Department
Admission: EM | Admit: 2023-04-25 | Discharge: 2023-04-25 | Disposition: A | Discharge status: Home / Self Care | Payer: 59 | Attending: Emergency Medicine | Admitting: Emergency Medicine

## 2023-04-25 ENCOUNTER — Other Ambulatory Visit: Payer: Self-pay

## 2023-04-25 ENCOUNTER — Emergency Department: Payer: 59

## 2023-04-25 DIAGNOSIS — S6991XA Unspecified injury of right wrist, hand and finger(s), initial encounter: Secondary | ICD-10-CM | POA: Diagnosis not present

## 2023-04-25 DIAGNOSIS — I10 Essential (primary) hypertension: Secondary | ICD-10-CM | POA: Insufficient documentation

## 2023-04-25 DIAGNOSIS — X58XXXA Exposure to other specified factors, initial encounter: Secondary | ICD-10-CM | POA: Diagnosis not present

## 2023-04-25 DIAGNOSIS — S68119A Complete traumatic metacarpophalangeal amputation of unspecified finger, initial encounter: Secondary | ICD-10-CM

## 2023-04-25 DIAGNOSIS — S61213A Laceration without foreign body of left middle finger without damage to nail, initial encounter: Secondary | ICD-10-CM | POA: Diagnosis not present

## 2023-04-25 DIAGNOSIS — S68622A Partial traumatic transphalangeal amputation of right middle finger, initial encounter: Secondary | ICD-10-CM | POA: Diagnosis not present

## 2023-04-25 DIAGNOSIS — Z23 Encounter for immunization: Secondary | ICD-10-CM | POA: Insufficient documentation

## 2023-04-25 DIAGNOSIS — S68623A Partial traumatic transphalangeal amputation of left middle finger, initial encounter: Secondary | ICD-10-CM | POA: Diagnosis not present

## 2023-04-25 DIAGNOSIS — S68113A Complete traumatic metacarpophalangeal amputation of left middle finger, initial encounter: Secondary | ICD-10-CM | POA: Diagnosis not present

## 2023-04-25 LAB — CBC WITH DIFFERENTIAL/PLATELET
Abs Immature Granulocytes: 0.01 10*3/uL (ref 0.00–0.07)
Basophils Absolute: 0 10*3/uL (ref 0.0–0.1)
Basophils Relative: 0 %
Eosinophils Absolute: 0 10*3/uL (ref 0.0–0.5)
Eosinophils Relative: 1 %
HCT: 36 % (ref 36.0–46.0)
Hemoglobin: 12.5 g/dL (ref 12.0–15.0)
Immature Granulocytes: 0 %
Lymphocytes Relative: 50 %
Lymphs Abs: 3.6 10*3/uL (ref 0.7–4.0)
MCH: 31.6 pg (ref 26.0–34.0)
MCHC: 34.7 g/dL (ref 30.0–36.0)
MCV: 90.9 fL (ref 80.0–100.0)
Monocytes Absolute: 0.4 10*3/uL (ref 0.1–1.0)
Monocytes Relative: 5 %
Neutro Abs: 3.1 10*3/uL (ref 1.7–7.7)
Neutrophils Relative %: 44 %
Platelets: 340 10*3/uL (ref 150–400)
RBC: 3.96 MIL/uL (ref 3.87–5.11)
RDW: 16.8 % — ABNORMAL HIGH (ref 11.5–15.5)
Smear Review: NORMAL
WBC: 7.2 10*3/uL (ref 4.0–10.5)
nRBC: 0 % (ref 0.0–0.2)

## 2023-04-25 LAB — BASIC METABOLIC PANEL
Anion gap: 12 (ref 5–15)
BUN: 5 mg/dL — ABNORMAL LOW (ref 6–20)
CO2: 21 mmol/L — ABNORMAL LOW (ref 22–32)
Calcium: 8.7 mg/dL — ABNORMAL LOW (ref 8.9–10.3)
Chloride: 106 mmol/L (ref 98–111)
Creatinine, Ser: 0.54 mg/dL (ref 0.44–1.00)
GFR, Estimated: >60 mL/min (ref >60)
Glucose, Bld: 97 mg/dL (ref 70–99)
Potassium: 3.3 mmol/L — ABNORMAL LOW (ref 3.5–5.1)
Sodium: 139 mmol/L (ref 135–145)

## 2023-04-25 MED ORDER — HYDROMORPHONE HCL 1 MG/ML IJ SOLN
1.0000 mg | Freq: Once | INTRAMUSCULAR | Status: AC
  Administered 2023-04-25: 1 mg via INTRAMUSCULAR
  Filled 2023-04-25: qty 1

## 2023-04-25 MED ORDER — CEPHALEXIN 500 MG PO CAPS
500.0000 mg | ORAL_CAPSULE | Freq: Once | ORAL | Status: AC
  Administered 2023-04-25: 500 mg via ORAL
  Filled 2023-04-25: qty 1

## 2023-04-25 MED ORDER — ACETAMINOPHEN 325 MG PO TABS
650.0000 mg | ORAL_TABLET | Freq: Once | ORAL | Status: AC
Start: 2023-04-25 — End: 2023-04-25
  Administered 2023-04-25: 650 mg via ORAL
  Filled 2023-04-25: qty 2

## 2023-04-25 MED ORDER — OXYCODONE-ACETAMINOPHEN 5-325 MG PO TABS: 2.0000 | ORAL_TABLET | Freq: Three times a day (TID) | ORAL | 0 refills | Status: DC | PRN

## 2023-04-25 MED ORDER — CEPHALEXIN 500 MG PO CAPS: 500.0000 mg | ORAL_CAPSULE | Freq: Four times a day (QID) | ORAL | 0 refills | Status: AC

## 2023-04-25 MED ORDER — BACITRACIN ZINC 500 UNIT/GM EX OINT: TOPICAL_OINTMENT | Freq: Once | CUTANEOUS | Status: DC

## 2023-04-25 MED ORDER — IBUPROFEN 800 MG PO TABS: 800.0000 mg | ORAL_TABLET | Freq: Three times a day (TID) | ORAL | 0 refills | Status: AC | PRN

## 2023-04-25 MED ORDER — ONDANSETRON 4 MG PO TBDP: 4.0000 mg | ORAL_TABLET | Freq: Four times a day (QID) | ORAL | 0 refills | Status: DC | PRN

## 2023-04-25 MED ORDER — TETANUS-DIPHTH-ACELL PERTUSSIS 5-2.5-18.5 LF-MCG/0.5 IM SUSY
0.5000 mL | PREFILLED_SYRINGE | Freq: Once | INTRAMUSCULAR | Status: AC
  Administered 2023-04-25: 0.5 mL via INTRAMUSCULAR
  Filled 2023-04-25: qty 0.5

## 2023-04-25 MED ORDER — TRANEXAMIC ACID 1000 MG/10ML IV SOLN
500.0000 mg | Freq: Once | INTRAVENOUS | Status: AC
  Administered 2023-04-25: 500 mg via TOPICAL
  Filled 2023-04-25: qty 10

## 2023-04-25 MED ORDER — LIDOCAINE HCL (PF) 1 % IJ SOLN
10.0000 mL | Freq: Once | INTRAMUSCULAR | Status: AC
  Administered 2023-04-25: 10 mL via INTRADERMAL
  Filled 2023-04-25: qty 10
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
## 2023-04-25 NOTE — Discharge Instructions (Addendum)
Please call the orthopedic office on Thursday to schedule a close outpatient appointment.  Please take your antibiotics until complete.  Please do not remove the dressing to your hand until you have been seen by orthopedics.  If it begins to bleed again and does not stop with holding direct pressure for 30 minutes, please return to the emergency department.  You are being provided a prescription for opiates (also known as narcotics) for pain control.  Opiates can be addictive and should only be used when absolutely necessary for pain control when other alternatives do not work.  We recommend you only use them for the recommended amount of time and only as prescribed.  Please do not take with other sedative medications or alcohol.  Please do not drive, operate machinery, make important decisions while taking opiates.  Please note that these medications can be addictive and have high abuse potential.  Patients can become addicted to narcotics after only taking them for a few days.  Please keep these medications locked away from children, teenagers or any family members with history of substance abuse.  Narcotic pain medicine may also make you constipated.  You may use over-the-counter medications such as MiraLAX, Colace to prevent constipation.  If you become constipated, you may use over-the-counter enemas as needed.  Itching and nausea are also common side effects of narcotic pain medication.  If you develop uncontrolled vomiting or a rash, please stop these medications and seek medical care.
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
## 2023-04-27 DIAGNOSIS — S61313A Laceration without foreign body of left middle finger with damage to nail, initial encounter: Secondary | ICD-10-CM | POA: Diagnosis not present

## 2023-05-04 DIAGNOSIS — S61313D Laceration without foreign body of left middle finger with damage to nail, subsequent encounter: Secondary | ICD-10-CM | POA: Diagnosis not present

## 2023-05-11 ENCOUNTER — Ambulatory Visit (INDEPENDENT_AMBULATORY_CARE_PROVIDER_SITE_OTHER): Payer: 59

## 2023-05-11 VITALS — BP 166/101 | HR 107 | Wt 135.0 lb

## 2023-05-11 DIAGNOSIS — Z3042 Encounter for surveillance of injectable contraceptive: Secondary | ICD-10-CM | POA: Diagnosis not present

## 2023-05-11 DIAGNOSIS — S61313D Laceration without foreign body of left middle finger with damage to nail, subsequent encounter: Secondary | ICD-10-CM | POA: Diagnosis not present

## 2023-05-11 MED ORDER — MEDROXYPROGESTERONE ACETATE 150 MG/ML IM SUSP
150.0000 mg | Freq: Once | INTRAMUSCULAR | Status: AC
  Administered 2023-05-11: 150 mg via INTRAMUSCULAR

## 2023-05-11 NOTE — Progress Notes (Signed)
    NURSE VISIT NOTE  Subjective:    Patient ID: Natasha Larson, female    DOB: [birth year], 43 y.o.   MRN: [id number]  HPI  Patient is a 43 y.o. [id number] female who presents for depo provera  injection.   Objective:    BP (!) 166/101 (BP Location: Left Arm, Cuff Size: Normal)   Pulse (!) 107   Wt 135 lb (61.2 kg)   BMI 24.69 kg/m   Last Annual: 12/12/2021. Last pap: 12/09/2020. Last Depo-Provera : 02/23/2023. Side Effects if any: none. Serum HCG indicated? No . Depo-Provera  150 mg IM given by: Burnard Ro, CMA. Site: Right Upper Outer Quandrant  Lab Review  @THIS  VISIT ONLY@  Assessment:   1. Encounter for surveillance of injectable contraceptive      Plan:   Next appointment due between March 28th and April 11th.    Burnard LITTIE Ro, CMA

## 2023-05-24 DIAGNOSIS — S61313D Laceration without foreign body of left middle finger with damage to nail, subsequent encounter: Secondary | ICD-10-CM | POA: Diagnosis not present

## 2023-07-27 ENCOUNTER — Ambulatory Visit: Payer: 59

## 2023-07-27 NOTE — Progress Notes (Deleted)
    NURSE VISIT NOTE  Subjective:    Patient ID: Natasha Larson, female    DOB: 1980/07/03, 43 y.o.   MRN: [id number]  HPI  Patient is a 43 y.o. [name] female who presents for depo provera injection.   Objective:    There were no vitals taken for this visit.  Last Annual: ***. Last pap: ***. Last Depo-Provera: ***. Side Effects if any: {NONE:21772}***. Serum HCG indicated? {YES/NO:21197}. Depo-Provera 150 mg IM given by: {AOB Clinical JYNWG:95621}. Site: {AOB INJ [id number]  Lab Review  @THIS  VISIT ONLY@  Assessment:   No diagnosis found.   Plan:   Next appointment due between 6/13 and 10/26/23.    Natasha Larson, CMA

## 2023-07-31 ENCOUNTER — Ambulatory Visit (INDEPENDENT_AMBULATORY_CARE_PROVIDER_SITE_OTHER)

## 2023-07-31 VITALS — BP 157/102 | Ht 62.0 in | Wt 138.0 lb

## 2023-07-31 DIAGNOSIS — Z3042 Encounter for surveillance of injectable contraceptive: Secondary | ICD-10-CM | POA: Diagnosis not present

## 2023-07-31 MED ORDER — MEDROXYPROGESTERONE ACETATE 150 MG/ML IM SUSY
150.0000 mg | PREFILLED_SYRINGE | Freq: Once | INTRAMUSCULAR | Status: AC
  Administered 2023-07-31: 150 mg via INTRAMUSCULAR

## 2023-07-31 NOTE — Progress Notes (Signed)
    NURSE VISIT NOTE  Subjective:    Patient ID: Natasha Larson, female    DOB: June 20, 1980, 43 y.o.   MRN: [id number]  HPI  Patient is a 43 y.o. [name] female who presents for depo provera injection.   Objective:    BP (!) 157/102   Ht 5\' 2"  (1.575 m)   Wt 138 lb (62.6 kg)   BMI 25.24 kg/m   Last Annual: 12/12/2021 . Last pap: 12/09/20. Last Depo-Provera: 05/11/23. Side Effects if any: none. Serum HCG indicated? No . Depo-Provera 150 mg IM given by: Beverely Pace, CMA. Site: Right Ventrogluteal Pt in office aware of high BP. Let her know SX to watch out for and when to go to ed /urgent care she is scheduled for next week for a PCP apt and will let them know about the high bp as well.    Assessment:   1. Surveillance for Depo-Provera contraception      Plan:  Needs to schedule for physical. Next appointment due between Jun 17-July 1     Loney Laurence, CMA

## 2023-08-23 ENCOUNTER — Ambulatory Visit: Admitting: Obstetrics

## 2023-08-23 ENCOUNTER — Encounter: Payer: Self-pay | Admitting: Obstetrics

## 2023-08-23 DIAGNOSIS — Z0000 Encounter for general adult medical examination without abnormal findings: Secondary | ICD-10-CM

## 2023-08-24 ENCOUNTER — Ambulatory Visit: Admitting: Obstetrics

## 2023-08-30 ENCOUNTER — Encounter: Payer: Self-pay | Admitting: Obstetrics

## 2023-10-18 ENCOUNTER — Ambulatory Visit

## 2023-10-19 ENCOUNTER — Ambulatory Visit

## 2023-10-22 ENCOUNTER — Ambulatory Visit (INDEPENDENT_AMBULATORY_CARE_PROVIDER_SITE_OTHER)

## 2023-10-22 ENCOUNTER — Other Ambulatory Visit (HOSPITAL_COMMUNITY)
Admission: RE | Admit: 2023-10-22 | Discharge: 2023-10-22 | Disposition: A | Discharge status: Home / Self Care | Source: Ambulatory Visit

## 2023-10-22 VITALS — BP 134/84 | HR 96 | Ht 62.0 in | Wt 132.0 lb

## 2023-10-22 DIAGNOSIS — Z1231 Encounter for screening mammogram for malignant neoplasm of breast: Secondary | ICD-10-CM

## 2023-10-22 DIAGNOSIS — Z1211 Encounter for screening for malignant neoplasm of colon: Secondary | ICD-10-CM

## 2023-10-22 DIAGNOSIS — Z01419 Encounter for gynecological examination (general) (routine) without abnormal findings: Secondary | ICD-10-CM | POA: Insufficient documentation

## 2023-10-22 DIAGNOSIS — Z3042 Encounter for surveillance of injectable contraceptive: Secondary | ICD-10-CM | POA: Diagnosis not present

## 2023-10-22 MED ORDER — MEDROXYPROGESTERONE ACETATE 150 MG/ML IM SUSP
150.0000 mg | Freq: Once | INTRAMUSCULAR | Status: AC
  Administered 2023-10-22: 150 mg via INTRAMUSCULAR

## 2023-10-22 NOTE — Progress Notes (Signed)
 Outpatient Gynecology Note: Annual Visit  Assessment/Plan:   Natasha Larson is a 43 y.o. female [phone] with normal well-woman gynecologic exam.   Well woman exam - Reviewed health maintenance topics as documented below. - Most recent pap smear in 2022, repeat cervical cancer screening due 2027. - Due for mammogram, ordered screening today.  - Contraception: BTL, uses Depo Provera  for managing heavy periods. - STI screening accepted.  - Cologuard ordered for colon cancer screening.  - Follows with Family Medicine for management of blood pressure and other recommended health screenings and labs.      Orders Placed This Encounter  Procedures   MM 3D SCREENING MAMMOGRAM BILATERAL BREAST    Standing Status:   Future    Expiration Date:   10/21/2024    Reason for Exam (SYMPTOM  OR DIAGNOSIS REQUIRED):   screening    Is the patient pregnant?:   No    Preferred imaging location?:   [facility]   Cologuard   HEP, RPR, HIV Panel   Current Outpatient Medications  Medication Instructions   amLODipine (NORVASC) 10 mg   ibuprofen  (ADVIL ) 800 mg, Oral, Every 8 hours PRN   losartan (COZAAR) 50 mg, Daily   medroxyPROGESTERone  (DEPO-PROVERA ) 150 mg, Intramuscular, Every 3 months   Vitamin D (Ergocalciferol) (DRISDOL) 50,000 Units, Weekly    No follow-ups on file.   Subjective:   Natasha Larson is a 43 y.o. female [phone] who presents for annual wellness visit.   Occupation works from home, Health visitor    Lives with kids    CONCERNS? Being depo with blood pressure medication  Well Woman Visit:  GYN HISTORY:  No LMP recorded. Patient has had an injection.     Menstrual History: OB History     Gravida  5   Para  3   Term  3   Preterm  0   AB  2   Living  3      SAB  2   IAB  0   Ectopic  0   Multiple  0   Live Births  3           Menarche age: 25 Uses Depo Provera  to manage heavy periods. No LMP recorded. Patient has had an  injection.     Intermenstrual bleeding, spotting, or discharge? Spotting occasionally Urinary incontinence? no  Sexually active: yes Number of sexual partners: one Gender of sexual Partners: male Dyspareunia? no Last pap:was normal in 2022 NILM/HPV neg History of abnormal Pap: no Gardasil series:  no STI history: no STI/HIV testing or immunizations needed? Yes.    Contraceptive methods: Depo-Provera , bilateral tubal ligation  Health Maintenance > Reviewed breast self-awareness, breast cancer in aunts > History of abnormal mammogram: n/a > Exercise: none,  > Dietary Supplements: Vit D, B12 > Body mass index is 24.14 kg/m.  > Recent dental visit Yes.   > Seat Belt Use: Yes.   > Safe in current relationship? Yes.   > Concern for alcohol abuse? Only drinks about 2 drinks a week   Tobacco or other drug use:  Tobacco Use: High Risk (10/18/2023)   Received from [facility]   Patient History    Smoking Tobacco Use: Some Days    Smokeless Tobacco Use: Never    Passive Exposure: Not on file      If >40:  Last mammogram:  never Age at menopause: n/a Last colonoscopy: ordered Cologuard  Last DEXA: has not  done Last lipid screening: April 2025  _________________________________________________________  Current Outpatient Medications  Medication Sig Dispense Refill   amLODipine (NORVASC) 10 MG tablet Take 10 mg by mouth.     ibuprofen  (ADVIL ) 800 MG tablet Take 1 tablet (800 mg total) by mouth every 8 (eight) hours as needed. 30 tablet 0   losartan (COZAAR) 50 MG tablet Take 50 mg by mouth daily.     medroxyPROGESTERone  (DEPO-PROVERA ) 150 MG/ML injection Inject 1 mL (150 mg total) into the muscle every 3 (three) months. 1 mL 3   Vitamin D, Ergocalciferol, (DRISDOL) 1.25 MG (50000 UNIT) CAPS capsule Take 50,000 Units by mouth once a week.     Current Facility-Administered Medications  Medication Dose Route Frequency Provider Last Rate Last Admin    medroxyPROGESTERone  (DEPO-PROVERA ) injection 150 mg  150 mg Intramuscular Q90 days Dove, Myra C, MD   150 mg at 07/05/22 1651   No Known Allergies  Past Medical History:  Diagnosis Date   Advanced maternal age in multigravida    Anemia    LAST HGB 01-26-17 WAS WNL   Family history of adverse reaction to anesthesia    PT STATES HER DAUGHTERS FACE BECAME SWOLLEN AFTER SURGERY (TONSILLECTOMY) AGE 51 AND STATES IT WAS CAUSED BY THE ANESTHESIA THAT WAS USED   Gestational diabetes    gestational   High-risk pregnancy, elderly multigravida in third trimester 06/27/2016   History of prior pregnancy with SGA newborn    Hx of preeclampsia, prior pregnancy, currently pregnant    Hypertension    Past Surgical History:  Procedure Laterality Date   DILATION AND CURETTAGE OF UTERUS     x2 for SAB and missed AB   INTRAUTERINE DEVICE (IUD) INSERTION  10/2016   Mirena    IUD REMOVAL  02/15/2017   Procedure: INTRAUTERINE DEVICE (IUD) REMOVAL;  Surgeon: Lake Read, MD;  Location: [facility] ORS;  Service: Gynecology;;   LAPAROSCOPIC TUBAL LIGATION Bilateral 02/15/2017   Procedure: LAPAROSCOPIC TUBAL LIGATION;  Surgeon: Lake Read, MD;  Location: [facility] ORS;  Service: Gynecology;  Laterality: Bilateral;   OB History     Gravida  5   Para  3   Term  3   Preterm  0   AB  2   Living  3      SAB  2   IAB  0   Ectopic  0   Multiple  0   Live Births  3          Social History   Tobacco Use   Smoking status: Every Day    Current packs/day: 0.25    Average packs/day: 0.3 packs/day for 13.0 years (3.3 ttl pk-yrs)    Types: Cigarettes   Smokeless tobacco: Never   Tobacco comments:    2 cig daily  Substance Use Topics   Alcohol use: No   Social History   Substance and Sexual Activity  Sexual Activity Yes   Birth control/protection: Surgical, Injection   Comment: Tubal Ligation    Immunization History  Administered Date(s) Administered   Moderna Sars-Covid-2  Vaccination 01/12/2020   PFIZER(Purple Top)SARS-COV-2 Vaccination 12/08/2019   Tdap 06/27/2016, 04/25/2023     Review Of Systems  Constitutional: Denied constitutional symptoms, night sweats, recent illness, fatigue, fever, insomnia and weight loss.  Eyes: Denied eye symptoms, eye pain, photophobia, vision change and visual disturbance.  Ears/Nose/Throat/Neck: Denied ear, nose, throat or neck symptoms, hearing loss, nasal discharge, sinus congestion and sore throat.  Cardiovascular: Denied cardiovascular symptoms, arrhythmia, chest pain/pressure,  edema, exercise intolerance, orthopnea and palpitations.  Respiratory: Denied pulmonary symptoms, asthma, pleuritic pain, productive sputum, cough, dyspnea and wheezing.  Gastrointestinal: Denied, gastro-esophageal reflux, melena, nausea and vomiting.  Genitourinary: Denied genitourinary symptoms including symptomatic vaginal discharge, pelvic relaxation issues, and urinary complaints.  Musculoskeletal: Denied musculoskeletal symptoms, stiffness, swelling, muscle weakness and myalgia.  Dermatologic: Denied dermatology symptoms, rash and scar.  Neurologic: Denied neurology symptoms, dizziness, headache, neck pain and syncope.  Psychiatric: Denied psychiatric symptoms, anxiety and depression.  Endocrine: Denied endocrine symptoms including hot flashes and night sweats.     Objective:   BP 134/84   Pulse 96   Ht 5' 2 (1.575 m)   Wt 132 lb (59.9 kg)   BMI 24.14 kg/m   Constitutional: Well-developed, well-nourished female in no acute distress Neurological: Alert and oriented to person, place, and time Psychiatric: Mood and affect appropriate Skin: No rashes or lesions Neck: Supple without masses. Trachea is midline.Thyroid is normal size without masses Lymphatics: No cervical, axillary, supraclavicular, or inguinal adenopathy noted Respiratory: Clear to auscultation bilaterally. Good air movement with normal work of  breathing. Cardiovascular: Regular rate and rhythm. Extremities grossly normal, nontender with no edema; pulses regular Gastrointestinal: Soft, nontender, nondistended. No masses or hernias appreciated. No hepatosplenomegaly. No fluid wave. No rebound or guarding. Breast Exam: normal appearance, no masses or tenderness Genitourinary:  deferred   Perineum/Anus: No lesions Rectal: deferred   Lauraine Range, CNM  10/22/23 4:09 PM

## 2023-10-22 NOTE — Assessment & Plan Note (Addendum)
-   Reviewed health maintenance topics as documented below. - Most recent pap smear in 2022, repeat cervical cancer screening due 2027. - Due for mammogram, ordered screening today.  - Contraception: BTL, uses Depo Provera  for managing heavy periods. - STI screening accepted.  - Cologuard ordered for colon cancer screening.  - Follows with Family Medicine for management of blood pressure and other recommended health screenings and labs.

## 2023-10-23 ENCOUNTER — Ambulatory Visit: Payer: Self-pay

## 2023-10-23 DIAGNOSIS — B9689 Other specified bacterial agents as the cause of diseases classified elsewhere: Secondary | ICD-10-CM

## 2023-10-23 LAB — HEP, RPR, HIV PANEL
HIV Screen 4th Generation wRfx: NONREACTIVE
Hepatitis B Surface Ag: NEGATIVE
RPR Ser Ql: NONREACTIVE

## 2023-10-24 LAB — CERVICOVAGINAL ANCILLARY ONLY
Bacterial Vaginitis (gardnerella): POSITIVE — AB
Candida Glabrata: NEGATIVE
Candida Vaginitis: NEGATIVE
Chlamydia: NEGATIVE
Comment: NEGATIVE
Comment: NEGATIVE
Comment: NEGATIVE
Comment: NEGATIVE
Comment: NEGATIVE
Comment: NORMAL
Neisseria Gonorrhea: NEGATIVE
Trichomonas: NEGATIVE

## 2023-10-26 MED ORDER — METRONIDAZOLE 500 MG PO TABS: 500.0000 mg | ORAL_TABLET | Freq: Two times a day (BID) | ORAL | 0 refills | Status: AC

## 2023-10-30 ENCOUNTER — Ambulatory Visit: Admitting: Obstetrics and Gynecology

## 2023-11-03 ENCOUNTER — Other Ambulatory Visit: Payer: Self-pay

## 2023-11-03 DIAGNOSIS — I10 Essential (primary) hypertension: Secondary | ICD-10-CM | POA: Insufficient documentation

## 2023-11-03 DIAGNOSIS — W25XXXA Contact with sharp glass, initial encounter: Secondary | ICD-10-CM | POA: Diagnosis not present

## 2023-11-03 DIAGNOSIS — S81812A Laceration without foreign body, left lower leg, initial encounter: Secondary | ICD-10-CM | POA: Diagnosis not present

## 2023-11-03 DIAGNOSIS — S8992XA Unspecified injury of left lower leg, initial encounter: Secondary | ICD-10-CM | POA: Diagnosis present

## 2023-11-03 DIAGNOSIS — Z23 Encounter for immunization: Secondary | ICD-10-CM | POA: Insufficient documentation

## 2023-11-03 MED ORDER — ACETAMINOPHEN 325 MG PO TABS
650.0000 mg | ORAL_TABLET | Freq: Once | ORAL | Status: AC
  Administered 2023-11-03: 650 mg via ORAL
  Filled 2023-11-03: qty 2
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
## 2023-11-04 ENCOUNTER — Emergency Department
Admission: EM | Admit: 2023-11-04 | Discharge: 2023-11-04 | Disposition: A | Discharge status: Home / Self Care | Attending: Emergency Medicine | Admitting: Emergency Medicine

## 2023-11-04 ENCOUNTER — Emergency Department

## 2023-11-04 DIAGNOSIS — S81812A Laceration without foreign body, left lower leg, initial encounter: Secondary | ICD-10-CM | POA: Diagnosis not present

## 2023-11-04 MED ORDER — TETANUS-DIPHTH-ACELL PERTUSSIS 5-2.5-18.5 LF-MCG/0.5 IM SUSY
0.5000 mL | PREFILLED_SYRINGE | Freq: Once | INTRAMUSCULAR | Status: AC
  Administered 2023-11-04: 0.5 mL via INTRAMUSCULAR
  Filled 2023-11-04: qty 0.5

## 2023-11-04 MED ORDER — LIDOCAINE HCL (PF) 1 % IJ SOLN
10.0000 mL | Freq: Once | INTRAMUSCULAR | Status: AC
  Administered 2023-11-04: 10 mL
  Filled 2023-11-04: qty 10

## 2023-11-04 NOTE — Discharge Instructions (Addendum)
 Please see your primary care doctor to get your sutures removed in 10 days.  Please make sure to keep the area clean and dry for the next 2 days.  Until the sutures are removed, do not soak it in any body of water.  You can take Tylenol  or ibuprofen  as needed for pain.
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
## 2024-01-11 ENCOUNTER — Ambulatory Visit: Payer: PRIVATE HEALTH INSURANCE

## 2024-01-11 NOTE — Progress Notes (Deleted)
    NURSE VISIT NOTE  Subjective:    Patient ID: BRIGIDA SCOTTI, female    DOB: [birth year], 43 y.o.   MRN: [id number]  HPI  Patient is a 43 y.o. [id number] female who presents for depo provera  injection.   Objective:    There were no vitals taken for this visit.  Last Annual: 10/22/23. Last pap: 12/09/20. Last Depo-Provera : 10/22/23. Side Effects if any: {NONE:21772}***. Serum HCG indicated? {YES/NO:21197}. Depo-Provera  150 mg IM given by: {AOB Clinical Dujqq:71459}. Site: {AOB INJ K3190326  Lab Review  No results found for any visits on 01/11/24.  Assessment:   No diagnosis found.   Plan:   Next appointment due between Nov 28 and Dec 26th 2025.    Harlene Gander, CMA

## 2024-01-11 NOTE — Patient Instructions (Incomplete)
 Birth Control Shot: What to Expect A birth control shot prevents pregnancy. A birth control shot is put in (injected) into the skin or a muscle. The shot contains the hormone progestin. Hormones are chemicals that affect how the body works. Progestin prevents pregnancy because it: Stops the ovaries from releasing eggs. Makes cervical mucus thicker. This prevents sperm from getting into the cervix. The cervix is the lowest part of the uterus. Thins the lining of the uterus to prevent a fertilized egg from attaching to the uterus. Tell a health care provider about: Any allergies you have. All medicines you take. These include vitamins, herbs, eye drops, and creams. Any bleeding problems you have. Any medical problems you have. Whether you're pregnant or may be pregnant. What are the risks? Your health care provider will talk with you about risks. These may include: Mood changes or depression. Your bones becoming thinner or weaker. This is called loss of bone density. This can happen if you get the shots for a long period of time. This can cause your bones to break. Blood clots. These are rare. A higher risk of an egg being fertilized outside your uterus, called an ectopic pregnancy.This is rare. What happens before? Your provider may do a physical exam. You may have a test to make sure you aren't pregnant. What happens during a birth control shot?  The area where the shot will be given will be cleaned. A needle will be put into a muscle in your upper arm or butt, or into the skin of your thigh or belly. The needle will be put on a syringe with the medicine in it. The medicine will be pushed through the syringe into your body. A small bandage may be put over the place where the shot was given. What happens after? After the shot, it's common to have: Soreness around the place where the shot was given for a couple of days. Spotting or bleeding between periods. Weight gain. Tender  breasts. Headaches. Belly pain. Ask your provider if you need to use an added method of birth control, such as a condom, sponge, or spermicide. If the first shot is given 1-7 days after the start of your last period, you won't need to use an added method of birth control. If the first shot is given at any other time during your menstrual cycle, you'll need to use an added method of birth control for 7 days after you get the shot. Follow these instructions at home: General instructions Take your medicines only as told. Do not rub or massage the place where the shot was given. Track your periods. This will help you know if they become irregular. Always use a condom to protect against sexually transmitted infections (STIs). Make an appointment in time for your next shot and mark it on your calendar. You must get a shot every 3 months (12-13 weeks) to prevent pregnancy. Lifestyle Do not smoke, vape, or use nicotine or tobacco. Eat foods that are high in calcium and vitamin D, such as milk, cheese, and salmon. Calcium helps keep your bones strong and may help with any loss in bone density caused by the birth control shot. Ask your provider if you should take supplements. Contact a health care provider if you: Have discharge or bleeding from your vagina that isn't normal. Miss a period or think you might be pregnant. Have mood changes or depression. Feel dizzy or light-headed. Have leg pain. Get help right away if you: Have chest pain  or cough up blood. Have trouble breathing. Have a really bad headache that doesn't go away. Have numbness in any part of your body. Have really bad pain in your belly. Have slurred speech or vision problems. These symptoms may be an emergency. Call 911 right away. Do not wait to see if the symptoms will go away. Do not drive yourself to the hospital. This information is not intended to replace advice given to you by your health care provider. Make sure you  discuss any questions you have with your health care provider. Document Revised: 12/21/2022 Document Reviewed: 12/21/2022 Elsevier Patient Education  2024 [employer].

## 2024-01-18 ENCOUNTER — Ambulatory Visit

## 2024-01-23 ENCOUNTER — Ambulatory Visit (INDEPENDENT_AMBULATORY_CARE_PROVIDER_SITE_OTHER)

## 2024-01-23 VITALS — BP 132/87 | HR 87 | Ht 62.0 in | Wt 138.3 lb

## 2024-01-23 DIAGNOSIS — Z3042 Encounter for surveillance of injectable contraceptive: Secondary | ICD-10-CM

## 2024-01-23 MED ORDER — MEDROXYPROGESTERONE ACETATE 150 MG/ML IM SUSP
150.0000 mg | Freq: Once | INTRAMUSCULAR | Status: AC
  Administered 2024-01-23: 150 mg via INTRAMUSCULAR

## 2024-01-23 NOTE — Progress Notes (Signed)
    NURSE VISIT NOTE  Subjective:    Patient ID: Natasha Larson, female    DOB: Feb 07, 1981, 43 y.o.   MRN: [id number]  HPI  Patient is a 43 y.o. [id number] female who presents for depo provera  injection.   Objective:    BP 132/87   Pulse 87   Ht 5' 2 (1.575 m)   Wt 138 lb 4.8 oz (62.7 kg)   BMI 25.30 kg/m   Last Annual: 10/22/23. Last pap: 12/09/20. Last Depo-Provera : 10/22/23. Side Effects if any:n/a. Serum HCG indicated? BTL. Depo-Provera  150 mg IM given by: Waddell Maxim, CMA. Site: Left Upper Outer Quandrant   Assessment:   1. Surveillance for Depo-Provera  contraception      Plan:   Next appointment due between 04/09/24 and 04/23/24.    Waddell JONELLE Maxim, CMA

## 2024-01-23 NOTE — Patient Instructions (Signed)
 Birth Control Shot: What to Expect A birth control shot prevents pregnancy. A birth control shot is put in (injected) into the skin or a muscle. The shot contains the hormone progestin. Hormones are chemicals that affect how the body works. Progestin prevents pregnancy because it: Stops the ovaries from releasing eggs. Makes cervical mucus thicker. This prevents sperm from getting into the cervix. The cervix is the lowest part of the uterus. Thins the lining of the uterus to prevent a fertilized egg from attaching to the uterus. Tell a health care provider about: Any allergies you have. All medicines you take. These include vitamins, herbs, eye drops, and creams. Any bleeding problems you have. Any medical problems you have. Whether you're pregnant or may be pregnant. What are the risks? Your health care provider will talk with you about risks. These may include: Mood changes or depression. Your bones becoming thinner or weaker. This is called loss of bone density. This can happen if you get the shots for a long period of time. This can cause your bones to break. Blood clots. These are rare. A higher risk of an egg being fertilized outside your uterus, called an ectopic pregnancy.This is rare. What happens before? Your provider may do a physical exam. You may have a test to make sure you aren't pregnant. What happens during a birth control shot?  The area where the shot will be given will be cleaned. A needle will be put into a muscle in your upper arm or butt, or into the skin of your thigh or belly. The needle will be put on a syringe with the medicine in it. The medicine will be pushed through the syringe into your body. A small bandage may be put over the place where the shot was given. What happens after? After the shot, it's common to have: Soreness around the place where the shot was given for a couple of days. Spotting or bleeding between periods. Weight gain. Tender  breasts. Headaches. Belly pain. Ask your provider if you need to use an added method of birth control, such as a condom, sponge, or spermicide. If the first shot is given 1-7 days after the start of your last period, you won't need to use an added method of birth control. If the first shot is given at any other time during your menstrual cycle, you'll need to use an added method of birth control for 7 days after you get the shot. Follow these instructions at home: General instructions Take your medicines only as told. Do not rub or massage the place where the shot was given. Track your periods. This will help you know if they become irregular. Always use a condom to protect against sexually transmitted infections (STIs). Make an appointment in time for your next shot and mark it on your calendar. You must get a shot every 3 months (12-13 weeks) to prevent pregnancy. Lifestyle Do not smoke, vape, or use nicotine or tobacco. Eat foods that are high in calcium and vitamin D, such as milk, cheese, and salmon. Calcium helps keep your bones strong and may help with any loss in bone density caused by the birth control shot. Ask your provider if you should take supplements. Contact a health care provider if you: Have discharge or bleeding from your vagina that isn't normal. Miss a period or think you might be pregnant. Have mood changes or depression. Feel dizzy or light-headed. Have leg pain. Get help right away if you: Have chest pain  or cough up blood. Have trouble breathing. Have a really bad headache that doesn't go away. Have numbness in any part of your body. Have really bad pain in your belly. Have slurred speech or vision problems. These symptoms may be an emergency. Call 911 right away. Do not wait to see if the symptoms will go away. Do not drive yourself to the hospital. This information is not intended to replace advice given to you by your health care provider. Make sure you  discuss any questions you have with your health care provider. Document Revised: 12/21/2022 Document Reviewed: 12/21/2022 Elsevier Patient Education  2024 [employer].

## 2024-02-28 ENCOUNTER — Ambulatory Visit
Admission: EM | Admit: 2024-02-28 | Discharge: 2024-02-28 | Disposition: A | Discharge status: Home / Self Care | Attending: Emergency Medicine | Admitting: Emergency Medicine

## 2024-02-28 ENCOUNTER — Ambulatory Visit (INDEPENDENT_AMBULATORY_CARE_PROVIDER_SITE_OTHER)

## 2024-02-28 ENCOUNTER — Other Ambulatory Visit: Payer: Self-pay

## 2024-02-28 ENCOUNTER — Emergency Department
Admission: EM | Admit: 2024-02-28 | Discharge: 2024-02-28 | Discharge status: Against Medical Advice | Attending: Emergency Medicine | Admitting: Emergency Medicine

## 2024-02-28 DIAGNOSIS — M25531 Pain in right wrist: Secondary | ICD-10-CM

## 2024-02-28 DIAGNOSIS — W1830XA Fall on same level, unspecified, initial encounter: Secondary | ICD-10-CM | POA: Diagnosis not present

## 2024-02-28 DIAGNOSIS — M25561 Pain in right knee: Secondary | ICD-10-CM

## 2024-02-28 DIAGNOSIS — Y92009 Unspecified place in unspecified non-institutional (private) residence as the place of occurrence of the external cause: Secondary | ICD-10-CM | POA: Diagnosis not present

## 2024-02-28 DIAGNOSIS — S62101A Fracture of unspecified carpal bone, right wrist, initial encounter for closed fracture: Secondary | ICD-10-CM

## 2024-02-28 DIAGNOSIS — R030 Elevated blood-pressure reading, without diagnosis of hypertension: Secondary | ICD-10-CM

## 2024-02-28 DIAGNOSIS — Z5321 Procedure and treatment not carried out due to patient leaving prior to being seen by health care provider: Secondary | ICD-10-CM | POA: Diagnosis not present

## 2024-02-28 MED ORDER — ACETAMINOPHEN 325 MG PO TABS
975.0000 mg | ORAL_TABLET | Freq: Once | ORAL | Status: AC
  Administered 2024-02-28: 975 mg via ORAL
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
## 2024-02-28 NOTE — ED Notes (Signed)
 Patient to First Nurse Desk stating that she could no longer wait. PT encouraged to stay but declined stating she would come back at a later time. NAD noted, ambulatory with steady gait out of ED.

## 2024-02-28 NOTE — ED Triage Notes (Signed)
 First Nurse Note: Patient to ED from [facility] for right radius fracture. Sent for reduction and splint.

## 2024-02-28 NOTE — Discharge Instructions (Addendum)
 Go directly to Er for right wrist reduction and splinting per Orthopedics, Dr. Ezra. Do not eat or drink anything until evaluated by ER provider.  Your right knee xray was negative

## 2024-02-28 NOTE — ED Triage Notes (Addendum)
 Pt to ED via POV. Pt sent from UC for right wrist pain. Pt presents with splint and sling. Pt states she fell at home last night, was told at [facility] broke two bones and would need to come to ED for reduction. Pt states 8/10 pain.

## 2024-02-28 NOTE — ED Triage Notes (Signed)
 Pt states she fell last night and hit her right arm/wrist. Now having pain with movement and swelling in right wrist.

## 2024-02-29 ENCOUNTER — Telehealth: Payer: Self-pay | Admitting: Emergency Medicine

## 2024-02-29 NOTE — Telephone Encounter (Signed)
 Pt called me back.  She is going to orthopedics today.

## 2024-02-29 NOTE — Telephone Encounter (Signed)
 Called patient due to left emergency department before provider exam to inquire about condition and follow up plans. Left message asking her to call me.

## 2024-04-11 ENCOUNTER — Ambulatory Visit

## 2024-04-11 VITALS — BP 138/88 | HR 93 | Wt 142.0 lb

## 2024-04-11 DIAGNOSIS — Z3042 Encounter for surveillance of injectable contraceptive: Secondary | ICD-10-CM

## 2024-04-11 MED ORDER — MEDROXYPROGESTERONE ACETATE 150 MG/ML IM SUSP
150.0000 mg | Freq: Once | INTRAMUSCULAR | Status: AC
  Administered 2024-04-11: 150 mg via INTRAMUSCULAR

## 2024-04-11 NOTE — Progress Notes (Signed)
° ° °  NURSE VISIT NOTE  Subjective:    Patient ID: Natasha Larson, female    DOB: 1980/07/16, 43 y.o.   MRN: [id number]  HPI  Patient is a 43 y.o. [id number] female who presents for depo provera  injection.   Objective:    BP 138/88   Pulse 93   Wt 142 lb (64.4 kg)   BMI 25.97 kg/m   Last Annual: 10/22/23. Last pap: 12/09/20. Last Depo-Provera : 01/23/24. Side Effects if any: none. Serum HCG indicated? No . Depo-Provera  150 mg IM given by: Rollo Louder, RN. Site: Left Ventrogluteal  Lab Review  No results found for any visits on 04/11/24.  Assessment:   1. Encounter for surveillance of injectable contraceptive      Plan:   Next appointment due between 06/27/24 and 07/11/24.    Rollo FORBES Louder, RN

## 2024-07-04 ENCOUNTER — Ambulatory Visit
